# Patient Record
Sex: Female | Born: 1950 | Race: White | Hispanic: No | Marital: Married | Smoking: Current every day smoker
Health system: Western US, Academic
[De-identification: ages and names within clinical notes are randomized; demographics above are authoritative.]

## PROBLEM LIST (undated history)

## (undated) DIAGNOSIS — E663 Overweight: Secondary | ICD-10-CM

## (undated) DIAGNOSIS — M25569 Pain in unspecified knee: Secondary | ICD-10-CM

## (undated) DIAGNOSIS — M25469 Effusion, unspecified knee: Secondary | ICD-10-CM

## (undated) DIAGNOSIS — D649 Anemia, unspecified: Secondary | ICD-10-CM

## (undated) DIAGNOSIS — K219 Gastro-esophageal reflux disease without esophagitis: Secondary | ICD-10-CM

## (undated) DIAGNOSIS — R7302 Impaired glucose tolerance (oral): Secondary | ICD-10-CM

## (undated) DIAGNOSIS — M549 Dorsalgia, unspecified: Secondary | ICD-10-CM

## (undated) DIAGNOSIS — R0602 Shortness of breath: Secondary | ICD-10-CM

## (undated) DIAGNOSIS — Z86010 Personal history of colonic polyps: Secondary | ICD-10-CM

## (undated) DIAGNOSIS — N390 Urinary tract infection, site not specified: Secondary | ICD-10-CM

## (undated) DIAGNOSIS — R002 Palpitations: Secondary | ICD-10-CM

## (undated) DIAGNOSIS — J209 Acute bronchitis, unspecified: Secondary | ICD-10-CM

## (undated) DIAGNOSIS — S335XXA Sprain of ligaments of lumbar spine, initial encounter: Secondary | ICD-10-CM

## (undated) DIAGNOSIS — R7309 Other abnormal glucose: Secondary | ICD-10-CM

## (undated) DIAGNOSIS — R079 Chest pain, unspecified: Secondary | ICD-10-CM

## (undated) DIAGNOSIS — I10 Essential (primary) hypertension: Secondary | ICD-10-CM

## (undated) DIAGNOSIS — M79609 Pain in unspecified limb: Secondary | ICD-10-CM

## (undated) DIAGNOSIS — G4710 Hypersomnia, unspecified: Secondary | ICD-10-CM

## (undated) DIAGNOSIS — E785 Hyperlipidemia, unspecified: Secondary | ICD-10-CM

## (undated) DIAGNOSIS — F411 Generalized anxiety disorder: Secondary | ICD-10-CM

## (undated) DIAGNOSIS — J309 Allergic rhinitis, unspecified: Secondary | ICD-10-CM

## (undated) DIAGNOSIS — R5383 Other fatigue: Secondary | ICD-10-CM

## (undated) DIAGNOSIS — F329 Major depressive disorder, single episode, unspecified: Secondary | ICD-10-CM

## (undated) DIAGNOSIS — M533 Sacrococcygeal disorders, not elsewhere classified: Secondary | ICD-10-CM

## (undated) DIAGNOSIS — R5381 Other malaise: Secondary | ICD-10-CM

## (undated) DIAGNOSIS — G5600 Carpal tunnel syndrome, unspecified upper limb: Secondary | ICD-10-CM

## (undated) DIAGNOSIS — F32A Depression, unspecified: Secondary | ICD-10-CM

## (undated) DIAGNOSIS — F419 Anxiety disorder, unspecified: Secondary | ICD-10-CM

## (undated) HISTORY — DX: Shortness of breath: R06.02

## (undated) HISTORY — DX: Other abnormal glucose: R73.09

## (undated) HISTORY — DX: Major depressive disorder, single episode, unspecified: F32.9

## (undated) HISTORY — DX: Other malaise: R53.81

## (undated) HISTORY — DX: Anemia, unspecified: D64.9

## (undated) HISTORY — DX: Overweight: E66.3

## (undated) HISTORY — DX: Other fatigue: R53.83

## (undated) HISTORY — DX: Palpitations: R00.2

## (undated) HISTORY — DX: Pain in unspecified limb: M79.609

## (undated) HISTORY — DX: Personal history of colonic polyps: Z86.010

## (undated) HISTORY — DX: Urinary tract infection, site not specified: N39.0

## (undated) HISTORY — DX: Sprain of ligaments of lumbar spine, initial encounter: S33.5XXA

## (undated) HISTORY — DX: Gastro-esophageal reflux disease without esophagitis: K21.9

## (undated) HISTORY — PX: COLONOSCOPY: SHX174

## (undated) HISTORY — DX: Hyperlipidemia, unspecified: E78.5

## (undated) HISTORY — PX: OTHER SURGICAL HISTORY: SHX169

## (undated) HISTORY — DX: Allergic rhinitis, unspecified: J30.9

## (undated) HISTORY — DX: Generalized anxiety disorder: F41.1

## (undated) HISTORY — DX: Effusion, unspecified knee: M25.469

## (undated) HISTORY — PX: POLYPECTOMY: SHX149

## (undated) HISTORY — DX: Sacrococcygeal disorders, not elsewhere classified: M53.3

## (undated) HISTORY — DX: Pain in unspecified knee: M25.569

## (undated) HISTORY — DX: Dorsalgia, unspecified: M54.9

## (undated) HISTORY — DX: Impaired glucose tolerance (oral): R73.02

## (undated) HISTORY — DX: Essential (primary) hypertension: I10

## (undated) HISTORY — DX: Carpal tunnel syndrome, unspecified upper limb: G56.00

## (undated) HISTORY — DX: Acute bronchitis, unspecified: J20.9

## (undated) HISTORY — DX: Chest pain, unspecified: R07.9

## (undated) HISTORY — DX: Hypersomnia, unspecified: G47.10

## (undated) HISTORY — DX: Depression, unspecified: F32.A

## (undated) HISTORY — PX: PB LAP GASTRIC BYPASS/ROUX-EN-Y: 43644

## (undated) HISTORY — PX: PB REDUCTION OF LARGE BREAST: 19318

## (undated) HISTORY — DX: Anxiety disorder, unspecified: F41.9

---

## 2001-06-25 ENCOUNTER — Encounter: Admission: RE | Admit: 2001-06-25 | Discharge: 2001-06-25 | Payer: Self-pay | Admitting: *Deleted

## 2001-06-25 ENCOUNTER — Encounter: Payer: Self-pay | Admitting: *Deleted

## 2001-07-09 ENCOUNTER — Ambulatory Visit (HOSPITAL_COMMUNITY): Admission: RE | Admit: 2001-07-09 | Discharge: 2001-07-09 | Payer: Self-pay | Admitting: *Deleted

## 2001-07-09 ENCOUNTER — Encounter (INDEPENDENT_AMBULATORY_CARE_PROVIDER_SITE_OTHER): Payer: Self-pay | Admitting: *Deleted

## 2002-04-01 ENCOUNTER — Other Ambulatory Visit: Admission: RE | Admit: 2002-04-01 | Discharge: 2002-04-01 | Payer: Self-pay | Admitting: Obstetrics and Gynecology

## 2002-07-30 ENCOUNTER — Other Ambulatory Visit: Admission: RE | Admit: 2002-07-30 | Discharge: 2002-07-30 | Payer: Self-pay | Admitting: Obstetrics and Gynecology

## 2004-06-22 ENCOUNTER — Other Ambulatory Visit: Admission: RE | Admit: 2004-06-22 | Discharge: 2004-06-22 | Payer: Self-pay | Admitting: Obstetrics and Gynecology

## 2004-07-14 ENCOUNTER — Ambulatory Visit (HOSPITAL_COMMUNITY): Admission: RE | Admit: 2004-07-14 | Discharge: 2004-07-14 | Payer: Self-pay | Admitting: Obstetrics and Gynecology

## 2004-07-14 ENCOUNTER — Encounter (INDEPENDENT_AMBULATORY_CARE_PROVIDER_SITE_OTHER): Payer: Self-pay | Admitting: *Deleted

## 2004-09-08 ENCOUNTER — Ambulatory Visit (HOSPITAL_COMMUNITY): Admission: RE | Admit: 2004-09-08 | Discharge: 2004-09-08 | Payer: Self-pay | Admitting: Internal Medicine

## 2004-09-08 ENCOUNTER — Ambulatory Visit: Payer: Self-pay | Admitting: Internal Medicine

## 2004-09-11 ENCOUNTER — Ambulatory Visit: Payer: Self-pay | Admitting: Internal Medicine

## 2006-03-11 ENCOUNTER — Ambulatory Visit: Payer: Self-pay | Admitting: Internal Medicine

## 2006-08-01 ENCOUNTER — Ambulatory Visit: Payer: Self-pay | Admitting: Internal Medicine

## 2006-08-15 ENCOUNTER — Ambulatory Visit: Payer: Self-pay | Admitting: Internal Medicine

## 2006-08-15 LAB — HM COLONOSCOPY: HM Colonoscopy: ABNORMAL

## 2006-11-11 ENCOUNTER — Ambulatory Visit: Payer: Self-pay | Admitting: Internal Medicine

## 2006-11-11 LAB — CONVERTED CEMR LAB
ALT: 25 units/L (ref 0–40)
AST: 23 units/L (ref 0–37)
Basophils Relative: 0.7 % (ref 0.0–1.0)
Bilirubin Urine: NEGATIVE
Bilirubin, Direct: 0.2 mg/dL (ref 0.0–0.3)
Calcium: 9.4 mg/dL (ref 8.4–10.5)
Chloride: 105 meq/L (ref 96–112)
Creatinine, Ser: 0.9 mg/dL (ref 0.4–1.2)
Crystals: NEGATIVE
Direct LDL: 108.6 mg/dL
Eosinophils Absolute: 0.2 10*3/uL (ref 0.0–0.6)
Eosinophils Relative: 2.1 % (ref 0.0–5.0)
GFR calc non Af Amer: 69 mL/min
Glucose, Bld: 102 mg/dL — ABNORMAL HIGH (ref 70–99)
HDL: 41.8 mg/dL (ref >39.0)
Hemoglobin: 13.8 g/dL (ref 12.0–15.0)
Ketones, ur: NEGATIVE mg/dL
Leukocytes, UA: NEGATIVE
Lymphocytes Relative: 28.5 % (ref 12.0–46.0)
MCV: 87.4 fL (ref 78.0–100.0)
Monocytes Absolute: 0.7 10*3/uL (ref 0.2–0.7)
Mucus, UA: NEGATIVE
Neutro Abs: 4.9 10*3/uL (ref 1.4–7.7)
Neutrophils Relative %: 60.7 % (ref 43.0–77.0)
Nitrite: NEGATIVE
Platelets: 276 10*3/uL (ref 150–400)
Sodium: 143 meq/L (ref 135–145)
TSH: 1.01 microintl units/mL (ref 0.35–5.50)
Total Bilirubin: 0.9 mg/dL (ref 0.3–1.2)
Total Protein, Urine: NEGATIVE mg/dL
Urine Glucose: NEGATIVE mg/dL
Urobilinogen, UA: 0.2 (ref 0.0–1.0)
VLDL: 46 mg/dL — ABNORMAL HIGH (ref 0–40)
Vitamin B-12: 266 pg/mL (ref 211–911)
WBC: 8.2 10*3/uL (ref 4.5–10.5)

## 2006-11-13 ENCOUNTER — Ambulatory Visit: Payer: Self-pay | Admitting: Cardiology

## 2006-12-04 ENCOUNTER — Ambulatory Visit: Payer: Self-pay | Admitting: Internal Medicine

## 2007-03-31 ENCOUNTER — Ambulatory Visit: Payer: Self-pay | Admitting: Internal Medicine

## 2007-04-29 ENCOUNTER — Ambulatory Visit: Payer: Self-pay | Admitting: Internal Medicine

## 2007-04-29 ENCOUNTER — Ambulatory Visit: Payer: Self-pay | Admitting: Vascular Surgery

## 2007-04-29 ENCOUNTER — Encounter: Payer: Self-pay | Admitting: Internal Medicine

## 2007-04-29 ENCOUNTER — Ambulatory Visit: Admission: RE | Admit: 2007-04-29 | Discharge: 2007-04-29 | Payer: Self-pay | Admitting: Internal Medicine

## 2007-04-29 DIAGNOSIS — K219 Gastro-esophageal reflux disease without esophagitis: Secondary | ICD-10-CM | POA: Insufficient documentation

## 2007-04-29 DIAGNOSIS — I10 Essential (primary) hypertension: Secondary | ICD-10-CM

## 2007-04-29 DIAGNOSIS — Z860100 Personal history of colon polyps, unspecified: Secondary | ICD-10-CM | POA: Insufficient documentation

## 2007-04-29 DIAGNOSIS — E785 Hyperlipidemia, unspecified: Secondary | ICD-10-CM

## 2007-04-29 DIAGNOSIS — F3289 Other specified depressive episodes: Secondary | ICD-10-CM

## 2007-04-29 DIAGNOSIS — F329 Major depressive disorder, single episode, unspecified: Secondary | ICD-10-CM

## 2007-04-29 DIAGNOSIS — E663 Overweight: Secondary | ICD-10-CM | POA: Insufficient documentation

## 2007-04-29 DIAGNOSIS — F411 Generalized anxiety disorder: Secondary | ICD-10-CM | POA: Insufficient documentation

## 2007-04-29 DIAGNOSIS — G5600 Carpal tunnel syndrome, unspecified upper limb: Secondary | ICD-10-CM

## 2007-04-29 HISTORY — DX: Essential (primary) hypertension: I10

## 2007-04-29 HISTORY — DX: Gastro-esophageal reflux disease without esophagitis: K21.9

## 2007-04-29 HISTORY — DX: Generalized anxiety disorder: F41.1

## 2007-04-29 HISTORY — DX: Major depressive disorder, single episode, unspecified: F32.9

## 2007-04-29 HISTORY — DX: Other specified depressive episodes: F32.89

## 2007-04-29 HISTORY — DX: Personal history of colon polyps, unspecified: Z86.0100

## 2007-04-29 HISTORY — DX: Hyperlipidemia, unspecified: E78.5

## 2007-04-29 HISTORY — DX: Carpal tunnel syndrome, unspecified upper limb: G56.00

## 2007-04-29 HISTORY — DX: Personal history of colonic polyps: Z86.010

## 2007-04-29 HISTORY — DX: Overweight: E66.3

## 2007-07-11 ENCOUNTER — Ambulatory Visit: Payer: Self-pay | Admitting: Internal Medicine

## 2007-11-24 ENCOUNTER — Ambulatory Visit: Payer: Self-pay | Admitting: Internal Medicine

## 2007-11-24 DIAGNOSIS — R5381 Other malaise: Secondary | ICD-10-CM

## 2007-11-24 DIAGNOSIS — D649 Anemia, unspecified: Secondary | ICD-10-CM

## 2007-11-24 DIAGNOSIS — R5383 Other fatigue: Secondary | ICD-10-CM

## 2007-11-24 DIAGNOSIS — J309 Allergic rhinitis, unspecified: Secondary | ICD-10-CM

## 2007-11-24 DIAGNOSIS — G4710 Hypersomnia, unspecified: Secondary | ICD-10-CM

## 2007-11-24 HISTORY — DX: Allergic rhinitis, unspecified: J30.9

## 2007-11-24 HISTORY — DX: Anemia, unspecified: D64.9

## 2007-11-24 HISTORY — DX: Hypersomnia, unspecified: G47.10

## 2007-11-24 HISTORY — DX: Other fatigue: R53.81

## 2007-11-24 LAB — CONVERTED CEMR LAB
ALT: 30 units/L (ref 0–35)
Albumin: 3.5 g/dL (ref 3.5–5.2)
Alkaline Phosphatase: 75 units/L (ref 39–117)
BUN: 11 mg/dL (ref 6–23)
Basophils Absolute: 0.1 10*3/uL (ref 0.0–0.1)
Bilirubin, Direct: 0.2 mg/dL (ref 0.0–0.3)
Calcium: 9.3 mg/dL (ref 8.4–10.5)
Chloride: 102 meq/L (ref 96–112)
Eosinophils Absolute: 0.3 10*3/uL (ref 0.0–0.6)
GFR calc Af Amer: 74 mL/min
GFR calc non Af Amer: 61 mL/min
HDL: 43.4 mg/dL (ref >39.0)
Lymphocytes Relative: 25.5 % (ref 12.0–46.0)
MCV: 88.1 fL (ref 78.0–100.0)
Monocytes Relative: 7.1 % (ref 3.0–11.0)
Neutro Abs: 5.1 10*3/uL (ref 1.4–7.7)
Platelets: 276 10*3/uL (ref 150–400)
RBC: 4.47 M/uL (ref 3.87–5.11)
Total CHOL/HDL Ratio: 4.1
Triglycerides: 174 mg/dL — ABNORMAL HIGH (ref 0–149)
VLDL: 35 mg/dL (ref 0–40)

## 2007-11-25 DIAGNOSIS — R7309 Other abnormal glucose: Secondary | ICD-10-CM

## 2007-11-25 HISTORY — DX: Other abnormal glucose: R73.09

## 2007-11-26 LAB — CONVERTED CEMR LAB
ALT: 30 units/L (ref 0–35)
Alkaline Phosphatase: 75 units/L (ref 39–117)
BUN: 11 mg/dL (ref 6–23)
Basophils Relative: 0.8 % (ref 0.0–1.0)
CO2: 30 meq/L (ref 19–32)
Calcium: 9.3 mg/dL (ref 8.4–10.5)
GFR calc Af Amer: 74 mL/min
GFR calc non Af Amer: 61 mL/min
HDL: 43.4 mg/dL (ref >39.0)
LDL Cholesterol: 101 mg/dL — ABNORMAL HIGH (ref 0–99)
Lymphocytes Relative: 25.5 % (ref 12.0–46.0)
Monocytes Relative: 7.1 % (ref 3.0–11.0)
Neutro Abs: 5.1 10*3/uL (ref 1.4–7.7)
Platelets: 276 10*3/uL (ref 150–400)
RBC: 4.47 M/uL (ref 3.87–5.11)
Total CHOL/HDL Ratio: 4.1
Total Protein: 6.8 g/dL (ref 6.0–8.3)
Triglycerides: 174 mg/dL — ABNORMAL HIGH (ref 0–149)
VLDL: 35 mg/dL (ref 0–40)

## 2008-01-13 ENCOUNTER — Ambulatory Visit: Payer: Self-pay | Admitting: Internal Medicine

## 2008-02-02 ENCOUNTER — Encounter: Payer: Self-pay | Admitting: Internal Medicine

## 2008-06-18 ENCOUNTER — Ambulatory Visit: Payer: Self-pay | Admitting: Internal Medicine

## 2008-06-18 DIAGNOSIS — M25569 Pain in unspecified knee: Secondary | ICD-10-CM

## 2008-06-18 DIAGNOSIS — M25469 Effusion, unspecified knee: Secondary | ICD-10-CM

## 2008-06-18 HISTORY — DX: Effusion, unspecified knee: M25.469

## 2008-06-18 HISTORY — DX: Pain in unspecified knee: M25.569

## 2008-06-25 ENCOUNTER — Encounter: Admission: RE | Admit: 2008-06-25 | Discharge: 2008-06-25 | Payer: Self-pay | Admitting: Internal Medicine

## 2008-07-06 ENCOUNTER — Encounter: Payer: Self-pay | Admitting: Internal Medicine

## 2008-07-07 ENCOUNTER — Ambulatory Visit: Payer: Self-pay | Admitting: Internal Medicine

## 2008-09-06 ENCOUNTER — Ambulatory Visit: Payer: Self-pay | Admitting: Internal Medicine

## 2008-09-06 DIAGNOSIS — M549 Dorsalgia, unspecified: Secondary | ICD-10-CM

## 2008-09-06 HISTORY — DX: Dorsalgia, unspecified: M54.9

## 2008-10-06 ENCOUNTER — Encounter (INDEPENDENT_AMBULATORY_CARE_PROVIDER_SITE_OTHER): Payer: Self-pay | Admitting: *Deleted

## 2008-10-06 ENCOUNTER — Telehealth (INDEPENDENT_AMBULATORY_CARE_PROVIDER_SITE_OTHER): Payer: Self-pay | Admitting: *Deleted

## 2008-10-08 HISTORY — PX: OTHER SURGICAL HISTORY: SHX169

## 2008-11-04 ENCOUNTER — Ambulatory Visit: Payer: Self-pay | Admitting: Internal Medicine

## 2008-11-04 DIAGNOSIS — J209 Acute bronchitis, unspecified: Secondary | ICD-10-CM

## 2008-11-04 HISTORY — DX: Acute bronchitis, unspecified: J20.9

## 2008-11-04 LAB — CONVERTED CEMR LAB
Basophils Absolute: 0.1 10*3/uL (ref 0.0–0.1)
Basophils Relative: 0.7 % (ref 0.0–3.0)
Bilirubin Urine: NEGATIVE
Bilirubin, Direct: 0.1 mg/dL (ref 0.0–0.3)
Calcium: 9.2 mg/dL (ref 8.4–10.5)
Cholesterol: 169 mg/dL (ref 0–200)
Creatinine, Ser: 0.9 mg/dL (ref 0.4–1.2)
Eosinophils Absolute: 0.2 10*3/uL (ref 0.0–0.7)
Folate: 10.6 ng/mL
GFR calc Af Amer: 83 mL/min
Glucose, Bld: 112 mg/dL — ABNORMAL HIGH (ref 70–99)
Ketones, ur: NEGATIVE mg/dL
LDL Cholesterol: 96 mg/dL (ref 0–99)
MCHC: 33.9 g/dL (ref 30.0–36.0)
MCV: 89.2 fL (ref 78.0–100.0)
Neutrophils Relative %: 63 % (ref 43.0–77.0)
Platelets: 270 10*3/uL (ref 150–400)
Sed Rate: 37 mm/hr — ABNORMAL HIGH (ref 0–22)
Sodium: 139 meq/L (ref 135–145)
TSH: 1.63 microintl units/mL (ref 0.35–5.50)
Total Protein: 7.2 g/dL (ref 6.0–8.3)
Triglycerides: 148 mg/dL (ref 0–149)
Urine Glucose: NEGATIVE mg/dL
Urobilinogen, UA: 0.2 (ref 0.0–1.0)
VLDL: 30 mg/dL (ref 0–40)

## 2008-11-05 ENCOUNTER — Telehealth (INDEPENDENT_AMBULATORY_CARE_PROVIDER_SITE_OTHER): Payer: Self-pay | Admitting: *Deleted

## 2008-11-08 ENCOUNTER — Telehealth (INDEPENDENT_AMBULATORY_CARE_PROVIDER_SITE_OTHER): Payer: Self-pay | Admitting: *Deleted

## 2008-11-16 ENCOUNTER — Ambulatory Visit: Payer: Self-pay | Admitting: Internal Medicine

## 2008-11-16 DIAGNOSIS — R0602 Shortness of breath: Secondary | ICD-10-CM

## 2008-11-16 DIAGNOSIS — R079 Chest pain, unspecified: Secondary | ICD-10-CM

## 2008-11-16 HISTORY — DX: Chest pain, unspecified: R07.9

## 2008-11-16 HISTORY — DX: Shortness of breath: R06.02

## 2008-11-17 ENCOUNTER — Telehealth: Payer: Self-pay | Admitting: Internal Medicine

## 2008-11-23 ENCOUNTER — Telehealth (INDEPENDENT_AMBULATORY_CARE_PROVIDER_SITE_OTHER): Payer: Self-pay | Admitting: *Deleted

## 2008-12-13 ENCOUNTER — Encounter: Payer: Self-pay | Admitting: Internal Medicine

## 2008-12-14 ENCOUNTER — Encounter: Payer: Self-pay | Admitting: Internal Medicine

## 2008-12-15 ENCOUNTER — Telehealth: Payer: Self-pay | Admitting: Internal Medicine

## 2009-03-17 ENCOUNTER — Encounter: Payer: Self-pay | Admitting: Internal Medicine

## 2009-03-22 ENCOUNTER — Telehealth (INDEPENDENT_AMBULATORY_CARE_PROVIDER_SITE_OTHER): Payer: Self-pay | Admitting: *Deleted

## 2009-03-25 ENCOUNTER — Encounter: Payer: Self-pay | Admitting: Internal Medicine

## 2009-09-06 ENCOUNTER — Ambulatory Visit: Payer: Self-pay | Admitting: Internal Medicine

## 2009-09-06 DIAGNOSIS — N390 Urinary tract infection, site not specified: Secondary | ICD-10-CM

## 2009-09-06 DIAGNOSIS — M79609 Pain in unspecified limb: Secondary | ICD-10-CM

## 2009-09-06 HISTORY — DX: Pain in unspecified limb: M79.609

## 2009-09-06 HISTORY — DX: Urinary tract infection, site not specified: N39.0

## 2009-09-06 LAB — CONVERTED CEMR LAB
Bilirubin Urine: NEGATIVE
Specific Gravity, Urine: 1.015
Urobilinogen, UA: 0.2
WBC Urine, dipstick: NEGATIVE
pH: 6.5

## 2009-11-01 ENCOUNTER — Ambulatory Visit: Payer: Self-pay | Admitting: Internal Medicine

## 2009-11-01 DIAGNOSIS — S339XXA Sprain of unspecified parts of lumbar spine and pelvis, initial encounter: Secondary | ICD-10-CM

## 2009-11-01 DIAGNOSIS — S335XXA Sprain of ligaments of lumbar spine, initial encounter: Secondary | ICD-10-CM

## 2009-11-01 HISTORY — DX: Sprain of unspecified parts of lumbar spine and pelvis, initial encounter: S33.9XXA

## 2009-11-02 LAB — CONVERTED CEMR LAB
ALT: 14 units/L (ref 0–35)
AST: 22 units/L (ref 0–37)
BUN: 8 mg/dL (ref 6–23)
Basophils Absolute: 0 10*3/uL (ref 0.0–0.1)
Bilirubin Urine: NEGATIVE
Bilirubin, Direct: 0.1 mg/dL (ref 0.0–0.3)
Calcium: 9.6 mg/dL (ref 8.4–10.5)
Cholesterol: 190 mg/dL (ref 0–200)
Creatinine, Ser: 0.7 mg/dL (ref 0.4–1.2)
Eosinophils Relative: 2.7 % (ref 0.0–5.0)
GFR calc non Af Amer: 91.48 mL/min (ref >60)
Glucose, Bld: 80 mg/dL (ref 70–99)
Hemoglobin, Urine: NEGATIVE
LDL Cholesterol: 110 mg/dL — ABNORMAL HIGH (ref 0–99)
Lymphocytes Relative: 35 % (ref 12.0–46.0)
Monocytes Relative: 8 % (ref 3.0–12.0)
Neutrophils Relative %: 53.4 % (ref 43.0–77.0)
Platelets: 218 10*3/uL (ref 150.0–400.0)
Potassium: 4.1 meq/L (ref 3.5–5.1)
RDW: 13.3 % (ref 11.5–14.6)
Total Bilirubin: 0.8 mg/dL (ref 0.3–1.2)
Total CHOL/HDL Ratio: 4
Total Protein, Urine: NEGATIVE mg/dL
Triglycerides: 154 mg/dL — ABNORMAL HIGH (ref 0.0–149.0)
Urine Glucose: NEGATIVE mg/dL
Urobilinogen, UA: 0.2 (ref 0.0–1.0)
VLDL: 30.8 mg/dL (ref 0.0–40.0)
WBC: 5 10*3/uL (ref 4.5–10.5)

## 2009-11-03 ENCOUNTER — Telehealth: Payer: Self-pay | Admitting: Internal Medicine

## 2010-02-21 ENCOUNTER — Ambulatory Visit: Payer: Self-pay | Admitting: Internal Medicine

## 2010-02-21 DIAGNOSIS — M533 Sacrococcygeal disorders, not elsewhere classified: Secondary | ICD-10-CM | POA: Insufficient documentation

## 2010-02-21 HISTORY — DX: Sacrococcygeal disorders, not elsewhere classified: M53.3

## 2010-07-27 ENCOUNTER — Observation Stay (HOSPITAL_COMMUNITY)
Admission: EM | Admit: 2010-07-27 | Discharge: 2010-07-28 | Payer: Self-pay | Source: Home / Self Care | Admitting: Emergency Medicine

## 2010-07-27 ENCOUNTER — Ambulatory Visit: Payer: Self-pay | Admitting: Cardiovascular Disease

## 2010-07-28 ENCOUNTER — Encounter (INDEPENDENT_AMBULATORY_CARE_PROVIDER_SITE_OTHER): Payer: Self-pay | Admitting: Internal Medicine

## 2010-09-13 ENCOUNTER — Ambulatory Visit: Payer: Self-pay | Admitting: Internal Medicine

## 2010-09-13 DIAGNOSIS — R002 Palpitations: Secondary | ICD-10-CM

## 2010-09-13 HISTORY — DX: Palpitations: R00.2

## 2010-10-29 ENCOUNTER — Encounter: Payer: Self-pay | Admitting: Internal Medicine

## 2010-11-07 NOTE — Assessment & Plan Note (Signed)
Summary: elep bp/racing heart/#/cd   Vital Signs:  Patient profile:   60 year old female Height:      62 inches Weight:      174.13 pounds BMI:     31.96 O2 Sat:      98 % on Room air Temp:     98 degrees F oral Pulse rate:   74 / minute BP sitting:   120 / 82  (left arm) Cuff size:   regular  Vitals Entered By: Zella Ball Ewing CMA (AAMA) (September 13, 2010 11:22 AM)  O2 Flow:  Room air CC: BP elevated, heart racing/RE   CC:  BP elevated and heart racing/RE.  History of Present Illness: here to f/u; recently hospd with elev BP and heart racing with essentialy neg w/u including hgb, TSH, [place], and routine labs, echo and urine.  Has ongoing anxiety problem, and thinks she has panic on occasion, stil very tearful and "feels empty inside" ;  has been on paxil for > 10 yrs., and recent incr to 40 mg did seem to help, but no longer in the past 6 mo or so.  Does excercise 2 hrs per day with much better diet, wt stable, scared she will have heart attack at any moment;  had stress test last at [facility] negative.  Recent echo normal . Pt denies CP, worsening sob, doe, wheezing, orthopnea, pnd, worsening LE edema,  dizziness or syncope  Pt denies new neuro symptoms such as headache, facial or extremity weakness  Pt denies polydipsia, polyuria  Overall good compliance with meds, trying to follow low chol diet. Denies worsening depressive symptoms, suicidal ideation. Has ongoing stressor with autistic son  Preventive Screening-Counseling & Management      Drug Use:  no.    Problems Prior to Update: 1)  Palpitations  (ICD-785.1) 2)  Coccygeal Pain  (ICD-724.79) 3)  Preventive Health Care  (ICD-V70.0) 4)  Sprain and Strain of Lumbosacral  (ICD-846.0) 5)  Leg Pain, Left  (ICD-729.5) 6)  Uti  (ICD-599.0) 7)  Back Pain  (ICD-724.5) 8)  Dyspnea  (ICD-786.05) 9)  Chest Pain  (ICD-786.50) 10)  Asthmatic Bronchitis, Acute  (ICD-466.0) 11)  Back Pain  (ICD-724.5) 12)  Joint Effusion, Left Knee   (ICD-719.06) 13)  Knee Pain, Right  (ICD-719.46) 14)  Other Abnormal Glucose  (ICD-790.29) 15)  Hypersomnia  (ICD-780.54) 16)  Fatigue  (ICD-780.79) 17)  Allergic Rhinitis  (ICD-477.9) 18)  Anemia-nos  (ICD-285.9) 19)  Carpal Tunnel Syndrome, Bilateral  (ICD-354.0) 20)  Hypertension  (ICD-401.9) 21)  Hyperlipidemia  (ICD-272.4) 22)  Gerd  (ICD-530.81) 23)  Colonic Polyps, Hx of  (ICD-V12.72) 24)  Overweight  (ICD-278.02) 25)  Depression  (ICD-311) 26)  Anxiety  (ICD-300.00)  Medications Prior to Update: 1)  Paxil 40 Mg Tabs (Paroxetine Hcl) .Marland Kitchen.. 1 By Mouth Once Daily 2)  Ecotrin Low Strength 81 Mg  Tbec (Aspirin) .Marland Kitchen.. 1po Qd 3)  Losartan Potassium 100 Mg Tabs (Losartan Potassium) .Marland Kitchen.. 1 By Mouth Once Daily  Current Medications (verified): 1)  Paxil 40 Mg Tabs (Paroxetine Hcl) .Marland Kitchen.. 1 By Mouth Once Daily 2)  Ecotrin Low Strength 81 Mg  Tbec (Aspirin) .Marland Kitchen.. 1po Qd 3)  Losartan Potassium 100 Mg Tabs (Losartan Potassium) .Marland Kitchen.. 1 By Mouth Once Daily 4)  Abilify 5 Mg Tabs (Aripiprazole) .... 1/2 By Mouth Once Daily  Allergies (verified): 1)  ! Sulfa  Past History:  Past Medical History: Last updated: 11/16/2008 Anxiety Depression Obesity Colonic polyps, hx of - adenomatous  GERD Hyperlipidemia Hypertension Carpal Tunnel Syndrome Anemia-NOS Hypersomnia chronic functional diarrhea Allergic rhinitis Cough/ L Post CP..................................................................................Marland KitchenWert    - CT ordered 11/16/08 >    - ACE d/c'd 11/16/08 >  Past Surgical History: Last updated: 09/06/2009 Caesarean section hx of D&C s/p abdominoplasty with mult revisions s/p gastric bypass 2010 - paid cash in [employer]  Social History: Last updated: 09/13/2010 Married 2 children - 1 autistic Quit smoking in 1988 Alcohol use-no Drug use-no  Risk Factors: Smoking Status: quit (11/24/2007)  Social History: Married 2 children - 1 autistic Quit smoking in 1988 Alcohol  use-no Drug use-no Drug Use:  no  Review of Systems       all otherwise negative per pt -    Physical Exam  General:  alert and overweight-appearing.   Head:  normocephalic and atraumatic.   Eyes:  vision grossly intact, pupils equal, and pupils round.   Ears:  R ear normal and L ear normal.   Nose:  no external deformity and no nasal discharge.   Mouth:  no gingival abnormalities and pharynx pink and moist.   Neck:  supple and no masses.   Lungs:  normal respiratory effort and normal breath sounds.   Heart:  normal rate and regular rhythm.   Msk:  no joint tenderness and no joint swelling.   Extremities:  no edema, no erythema  Psych:  depressed affect and moderately anxious.     Impression & Recommendations:  Problem # 1:  HYPERTENSION (ICD-401.9)  Her updated medication list for this problem includes:    Losartan Potassium 100 Mg Tabs (Losartan potassium) .Marland Kitchen... 1 by mouth once daily  BP today: 120/82 Prior BP: 112/72 (02/21/2010)  Labs Reviewed: K+: 4.1 (11/01/2009) Creat: : 0.7 (11/01/2009)   Chol: 190 (11/01/2009)   HDL: 49.40 (11/01/2009)   LDL: 110 (11/01/2009)   TG: 154.0 (11/01/2009) stable overall by hx and exam, ok to continue meds/tx as is , no further meds needed, consider change to beta blocker if palps persist but hold for now in light of the depressoin  Problem # 2:  DEPRESSION (ICD-311)  Her updated medication list for this problem includes:    Paxil 40 Mg Tabs (Paroxetine hcl) .Marland Kitchen... 1 by mouth once daily to add the lower dose abilify; f/u next visit, or sooner such as ER for any suicidal ideation  Problem # 3:  ANXIETY (ICD-300.00)  Her updated medication list for this problem includes:    Paxil 40 Mg Tabs (Paroxetine hcl) .Marland Kitchen... 1 by mouth once daily declines xanax as needed for what sounds like panic attack   Problem # 4:  PALPITATIONS (ICD-785.1)  benign sounding but cant r/o pheo - to check urine tests  Orders: T-Urine 24 Hr. Catecholamines  [phone]) T-Urine 24 Hr. Metanephrines [phone])  Complete Medication List: 1)  Paxil 40 Mg Tabs (Paroxetine hcl) .Marland Kitchen.. 1 by mouth once daily 2)  Ecotrin Low Strength 81 Mg Tbec (Aspirin) .Marland Kitchen.. 1po qd 3)  Losartan Potassium 100 Mg Tabs (Losartan potassium) .Marland Kitchen.. 1 by mouth once daily 4)  Abilify 5 Mg Tabs (Aripiprazole) .... 1/2 by mouth once daily  Patient Instructions: 1)  Please take all new medications as prescribed - the abilify at HALF of the 5 mg pill per day 2)  Continue all previous medications as before this visit  3)  Please go to the Lab in the basement for your urine tests today  4)  Please schedule a follow-up appointment in 6 weeks for CPX with  labs Prescriptions: ABILIFY 5 MG TABS (ARIPIPRAZOLE) 1 by mouth once daily  #30 x 11   Entered and Authorized by:   Corwin Levins MD   Signed by:   Corwin Levins MD on 09/13/2010   Method used:   Electronically to        [facility] [phone]* (retail)       [address]       [place], [place]  [address]       Ph: [phone]       Fax: [phone]   RxID:   [id number]    Orders Added: 1)  T-Urine 24 Hr. Catecholamines [phone] 2)  T-Urine 24 Hr. Metanephrines [[id number]] 3)  Est. Patient Level IV [29937]

## 2010-11-07 NOTE — Assessment & Plan Note (Signed)
Summary: F/U APPT/#/CD   Vital Signs:  Patient profile:   60 year old female Height:      62 inches Weight:      172.38 pounds BMI:     31.64 O2 Sat:      98 % on Room air Temp:     97.9 degrees F oral Pulse rate:   63 / minute BP sitting:   112 / 72  (left arm) Cuff size:   regular  Vitals Entered ByZella Ball Ewing (Feb 21, 2010 11:28 AM)  O2 Flow:  Room air CC: followup/RE   CC:  followup/RE.  History of Present Illness: here with very tender persistent 8 mo left coccyx tenderness that is very concerning as it is moderate and seems to persist;  she remembers  20 yrs ago [place] with [place] with pain for  6 mo ;;  no recent injury; ovoerall lost 110 lbs since the gastric bypass and has tender area specifically to the left ischial area;  Pt denies CP, sob, doe, wheezing, orthopnea, pnd, worsening LE edema, palps, dizziness or syncope   Pt denies new neuro symptoms such as headache, facial or extremity weakness  Trying to follow lower chol diet.  Taking paxil and doing well with controlled anxiety per pt, no panic, and no depressive symptoms or suicidal ideation.   Trying to follow lower chol diet  Problems Prior to Update: 1)  Coccygeal Pain  (ICD-724.79) 2)  Preventive Health Care  (ICD-V70.0) 3)  Sprain and Strain of Lumbosacral  (ICD-846.0) 4)  Leg Pain, Left  (ICD-729.5) 5)  Uti  (ICD-599.0) 6)  Back Pain  (ICD-724.5) 7)  Dyspnea  (ICD-786.05) 8)  Chest Pain  (ICD-786.50) 9)  Asthmatic Bronchitis, Acute  (ICD-466.0) 10)  Back Pain  (ICD-724.5) 11)  Joint Effusion, Left Knee  (ICD-719.06) 12)  Knee Pain, Right  (ICD-719.46) 13)  Other Abnormal Glucose  (ICD-790.29) 14)  Hypersomnia  (ICD-780.54) 15)  Fatigue  (ICD-780.79) 16)  Allergic Rhinitis  (ICD-477.9) 17)  Anemia-nos  (ICD-285.9) 18)  Carpal Tunnel Syndrome, Bilateral  (ICD-354.0) 19)  Hypertension  (ICD-401.9) 20)  Hyperlipidemia  (ICD-272.4) 21)  Gerd  (ICD-530.81) 22)  Colonic Polyps, Hx of   (ICD-V12.72) 23)  Overweight  (ICD-278.02) 24)  Depression  (ICD-311) 25)  Anxiety  (ICD-300.00)  Medications Prior to Update: 1)  Paxil 40 Mg Tabs (Paroxetine Hcl) .Marland Kitchen.. 1 By Mouth Once Daily 2)  Ecotrin Low Strength 81 Mg  Tbec (Aspirin) .Marland Kitchen.. 1po Qd 3)  Losartan Potassium 100 Mg Tabs (Losartan Potassium) .Marland Kitchen.. 1 By Mouth Once Daily  Current Medications (verified): 1)  Paxil 40 Mg Tabs (Paroxetine Hcl) .Marland Kitchen.. 1 By Mouth Once Daily 2)  Ecotrin Low Strength 81 Mg  Tbec (Aspirin) .Marland Kitchen.. 1po Qd 3)  Losartan Potassium 100 Mg Tabs (Losartan Potassium) .Marland Kitchen.. 1 By Mouth Once Daily  Allergies (verified): 1)  ! Sulfa  Past History:  Past Medical History: Last updated: 11/16/2008 Anxiety Depression Obesity Colonic polyps, hx of - adenomatous GERD Hyperlipidemia Hypertension Carpal Tunnel Syndrome Anemia-NOS Hypersomnia chronic functional diarrhea Allergic rhinitis Cough/ L Post CP..................................................................................Marland KitchenWert    - CT ordered 11/16/08 >    - ACE d/c'd 11/16/08 >  Past Surgical History: Last updated: 09/06/2009 Caesarean section hx of D&C s/p abdominoplasty with mult revisions s/p gastric bypass 2010 - paid cash in [employer]  Social History: Last updated: 11/16/2008 Married 2 children - 1 autistic Quit smoking in 1988 Alcohol use-no  Risk Factors: Smoking  Status: quit (11/24/2007)  Review of Systems       all otherwise negative per pt -    Physical Exam  General:  alert and overweight-appearing.   Head:  normocephalic and atraumatic.   Eyes:  vision grossly intact, pupils equal, and pupils round.   Ears:  R ear normal and L ear normal.   Nose:  no external deformity and no nasal discharge.   Mouth:  no gingival abnormalities and pharynx pink and moist.   Neck:  supple and no masses.   Lungs:  normal respiratory effort and normal breath sounds.   Heart:  normal rate and regular rhythm.   Msk:  tender to left  ischial area Extremities:  no edema, no erythema    Impression & Recommendations:  Problem # 1:  COCCYGEAL PAIN (ICD-724.79) persiistnet, recent films reivewed with pt, refer to ortho per pt request Orders: Orthopedic Surgeon Referral (Ortho Surgeon)  Problem # 2:  HYPERTENSION (ICD-401.9)  Her updated medication list for this problem includes:    Losartan Potassium 100 Mg Tabs (Losartan potassium) .Marland Kitchen... 1 by mouth once daily  BP today: 112/72 Prior BP: 140/100 (11/01/2009)  Labs Reviewed: K+: 4.1 (11/01/2009) Creat: : 0.7 (11/01/2009)   Chol: 190 (11/01/2009)   HDL: 49.40 (11/01/2009)   LDL: 110 (11/01/2009)   TG: 154.0 (11/01/2009) stable overall by hx and exam, ok to continue meds/tx as is   Problem # 3:  HYPERLIPIDEMIA (ICD-272.4)  Labs Reviewed: SGOT: 22 (11/01/2009)   SGPT: 14 (11/01/2009)   HDL:49.40 (11/01/2009), 43.8 (11/04/2008)  LDL:110 (11/01/2009), 96 (52/84/1324)  Chol:190 (11/01/2009), 169 (11/04/2008)  Trig:154.0 (11/01/2009), 148 (11/04/2008) d/w pt - declines statin or other med, to cont diet  Problem # 4:  ANXIETY (ICD-300.00)  Her updated medication list for this problem includes:    Paxil 40 Mg Tabs (Paroxetine hcl) .Marland Kitchen... 1 by mouth once daily treat as above, f/u any worsening signs or symptoms   Complete Medication List: 1)  Paxil 40 Mg Tabs (Paroxetine hcl) .Marland Kitchen.. 1 by mouth once daily 2)  Ecotrin Low Strength 81 Mg Tbec (Aspirin) .Marland Kitchen.. 1po qd 3)  Losartan Potassium 100 Mg Tabs (Losartan potassium) .Marland Kitchen.. 1 by mouth once daily  Patient Instructions: 1)  Continue all previous medications as before this visit  2)  You will be contacted about the referral(s) to: orthopedic 3)  Please schedule a follow-up appointment Jan 2012 with CPX labs

## 2010-11-07 NOTE — Assessment & Plan Note (Signed)
Summary: "PROBLEM" WILL DISCUSS W/MD ONLY/#/CD   Vital Signs:  Patient profile:   60 year old female Height:      62 inches Weight:      178 pounds BMI:     32.67 O2 Sat:      98 % on Room air Temp:     98 degrees F oral Pulse rate:   65 / minute BP sitting:   140 / 100  (left arm) Cuff size:   regular  Vitals Entered ByZella Ball Ewing (November 01, 2009 9:30 AM)  O2 Flow:  Room air  CC: sch. labs, tail bone pain, referral OBGYN/RE   CC:  sch. labs, tail bone pain, and referral OBGYN/RE.  History of Present Illness: here with chronic talibone pain , assoc with the wt loss, tender to touch - worse to walk and sit;  no falls or injury,  no fever, unintentional wt loss (lost approx 100 lbs after the gastric  bypass;  due for GYN eval and needs referral;  declines lfu shot today; still c/o years of left thigh/? muscle pain to the left thigh but also has swellign to left leg more than right;  BP has not been elevated in the recent past;  has been trying to follow lower chol diet; has been off statin since july and just tyring diet; Pt denies CP, sob, doe, wheezing, orthopnea, pnd, worsening LE edema, palps, dizziness or syncope   Pt denies new neuro symptoms such as headache, facial or extremity weakness   Problems Prior to Update: 1)  Preventive Health Care  (ICD-V70.0) 2)  Sprain and Strain of Lumbosacral  (ICD-846.0) 3)  Leg Pain, Left  (ICD-729.5) 4)  Uti  (ICD-599.0) 5)  Back Pain  (ICD-724.5) 6)  Dyspnea  (ICD-786.05) 7)  Chest Pain  (ICD-786.50) 8)  Asthmatic Bronchitis, Acute  (ICD-466.0) 9)  Back Pain  (ICD-724.5) 10)  Joint Effusion, Left Knee  (ICD-719.06) 11)  Knee Pain, Right  (ICD-719.46) 12)  Other Abnormal Glucose  (ICD-790.29) 13)  Hypersomnia  (ICD-780.54) 14)  Fatigue  (ICD-780.79) 15)  Allergic Rhinitis  (ICD-477.9) 16)  Anemia-nos  (ICD-285.9) 17)  Carpal Tunnel Syndrome, Bilateral  (ICD-354.0) 18)  Hypertension  (ICD-401.9) 19)  Hyperlipidemia   (ICD-272.4) 20)  Gerd  (ICD-530.81) 21)  Colonic Polyps, Hx of  (ICD-V12.72) 22)  Overweight  (ICD-278.02) 23)  Depression  (ICD-311) 24)  Anxiety  (ICD-300.00)  Medications Prior to Update: 1)  Omeprazole 20 Mg Cpdr (Omeprazole) .... 2 By Mouth Once Daily 2)  Paxil 40 Mg Tabs (Paroxetine Hcl) .Marland Kitchen.. 1 By Mouth Once Daily 3)  Ecotrin Low Strength 81 Mg  Tbec (Aspirin) .Marland Kitchen.. 1po Qd 4)  Ciprofloxacin Hcl 500 Mg Tabs (Ciprofloxacin Hcl) .Marland Kitchen.. 1 By Mouth Two Times A Day  Current Medications (verified): 1)  Paxil 40 Mg Tabs (Paroxetine Hcl) .Marland Kitchen.. 1 By Mouth Once Daily 2)  Ecotrin Low Strength 81 Mg  Tbec (Aspirin) .Marland Kitchen.. 1po Qd  Allergies (verified): 1)  ! Sulfa  Past History:  Past Medical History: Last updated: 11/16/2008 Anxiety Depression Obesity Colonic polyps, hx of - adenomatous GERD Hyperlipidemia Hypertension Carpal Tunnel Syndrome Anemia-NOS Hypersomnia chronic functional diarrhea Allergic rhinitis Cough/ L Post CP..................................................................................Marland KitchenWert    - CT ordered 11/16/08 >    - ACE d/c'd 11/16/08 >  Past Surgical History: Last updated: 09/06/2009 Caesarean section hx of D&C s/p abdominoplasty with mult revisions s/p gastric bypass 2010 - paid cash in [place]  Family History: Last updated: 11/24/2007 breast  cancer prostate cancer heart disease rare renal disease in son  Social History: Last updated: 11/16/2008 Married 2 children - 1 autistic Quit smoking in 1988 Alcohol use-no  Risk Factors: Smoking Status: quit (11/24/2007)  Review of Systems  The patient denies anorexia, fever, weight loss, weight gain, vision loss, decreased hearing, hoarseness, chest pain, syncope, dyspnea on exertion, peripheral edema, prolonged cough, headaches, hemoptysis, abdominal pain, melena, hematochezia, severe indigestion/heartburn, hematuria, muscle weakness, suspicious skin lesions, difficulty walking, depression,  unusual weight change, abnormal bleeding, and enlarged lymph nodes.         all otherwise negative per pt  Physical Exam  General:  alert and overweight-appearing but much improved Head:  normocephalic and atraumatic.  normocephalic and atraumatic.   Eyes:  vision grossly intact, pupils equal, and pupils round.   Ears:  R ear normal and L ear normal.   Nose:  no external deformity and no nasal discharge.   Mouth:  no gingival abnormalities and pharynx pink and moist.   Neck:  supple and no masses.   Lungs:  normal respiratory effort and normal breath sounds.   Heart:  normal rate and regular rhythm.   Abdomen:  soft, non-tender, and normal bowel sounds.   Msk:  no joint tenderness and no joint swelling.  , lower sacral tender noted, also tender over left greater trochanter mild noted Extremities:  no edema, no erythema except for trace edema LLE  to the knee Neurologic:  cranial nerves II-XII intact, strength normal in lower extremities, and sensation intact to light touch.   Skin:  color normal and no rashes.   Psych:  normally interactive and slightly anxious.     Impression & Recommendations:  Problem # 1:  Preventive Health Care (ICD-V70.0)  Overall doing well, age appropriate education and counseling updated and referral for appropriate preventive services done unless declined, immunizations up to date or declined, diet counseling done if overweight, urged to quit smoking if smokes , most recent labs reviewed and current ordered if appropriate, ecg reviewed or declined (interpretation per ECG scanned in the EMR if done); information regarding Medicare Prevention requirements given if appropriate   Orders: T-Vitamin D (25-Hydroxy) ([id number]) TLB-BMP (Basic Metabolic Panel-BMET) (80048-METABOL) TLB-CBC Platelet - w/Differential (85025-CBCD) TLB-Hepatic/Liver Function Pnl (80076-HEPATIC) TLB-Lipid Panel (80061-LIPID) TLB-TSH (Thyroid Stimulating Hormone) (84443-TSH) TLB-Udip  ONLY (81003-UDIP)  Problem # 2:  SPRAIN AND STRAIN OF LUMBOSACRAL (ICD-846.0) suspect msk strain - for films, tylenol as needed, f/u any worsening s/s Orders: T-Coccyx/Sacrum 2 Views (72220TC) T-Lumbar Spine Complete, 5 Views (71110TC) T-Sacroiliac Joints (72202TC)  Problem # 3:  LEG PAIN, LEFT (ICD-729.5) c/w left hip bursitis clinically - she declines ortho eval and cortisone shot, cont tylenol as needed   Problem # 4:  HYPERLIPIDEMIA (ICD-272.4)  Labs Reviewed: SGOT: 36 (11/04/2008)   SGPT: 35 (11/04/2008)   HDL:43.8 (11/04/2008), 43.4 (11/25/2007)  LDL:96 (11/04/2008), 101 (09/81/1914)  Chol:169 (11/04/2008), 179 (11/25/2007)  Trig:148 (11/04/2008), 174 (11/25/2007) may need to re-start statin  Complete Medication List: 1)  Paxil 40 Mg Tabs (Paroxetine hcl) .Marland Kitchen.. 1 by mouth once daily 2)  Ecotrin Low Strength 81 Mg Tbec (Aspirin) .Marland Kitchen.. 1po qd  Patient Instructions: 1)  please call for your yearly pap smear and mammogram - consider [facility], and  Imaging  2)  Please go to the Lab in the basement for your blood and/or urine tests today 3)  Please go to Radiology in the basement level for your X-Ray today 4)  please check your blood pressure daily  -  call for average blood pressure more than 140/90 in 1 to 2 wks 5)  please continue tylenol for pain 6)  Continue all previous medications as before this visit  7)  Please schedule a follow-up appointment in 1 year or sooner if needed Prescriptions: PAXIL 40 MG TABS (PAROXETINE HCL) 1 by mouth once daily  #90 x 3   Entered and Authorized by:   Corwin Levins MD   Signed by:   Corwin Levins MD on 11/01/2009   Method used:   Electronically to        [facility] [phone]* (retail)       [address]       [place], [place]  [address]       Ph: [phone]       Fax: [phone]   RxID:   [id number]

## 2010-11-07 NOTE — Progress Notes (Signed)
Summary: BP  Phone Note Call from Patient Call back at Home Phone [phone]   Summary of Call: Patient left message on triage to call her regarding BP. Yesterday AM it was 171/98 and at 9 pm last night it was 199/99. Initial call taken by: Lucious Groves,  November 03, 2009 8:58 AM  Follow-up for Phone Call        Pt states that she thinks she may need to re-start BP meds. please advise Follow-up by: Margaret Pyle, CMA,  November 03, 2009 10:27 AM  Additional Follow-up for Phone Call Additional follow up Details #1::        i have concerns about how accurate her BP is at home, but will go ahead and start losartan 100 mg per day - done escript;  will need ROV to re-check 4 wks; pt to call if BP's remain elevated, or come to office for nurse visit at her convenience WITH her machine to check Additional Follow-up by: Corwin Levins MD,  November 03, 2009 1:03 PM    Additional Follow-up for Phone Call Additional follow up Details #2::    left message on machine for pt to return my call. Margaret Pyle, CMA  November 03, 2009 1:59 PM   Pt says she saw Dr. Laural Benes last year and was given samples of Benicar, which she took this morning (pt did not mention this the first time I spoke with her). Pt says that her BP is now 136/82. Pt is requesting a Rx for Benicar instead. Follow-up by: Margaret Pyle, CMA,  November 03, 2009 2:37 PM  Additional Follow-up for Phone Call Additional follow up Details #3:: Details for Additional Follow-up Action Taken: I would not feel comfortable with that as benicar is very strong and likely to make her BP too low since she goes up and down apparently so much;  the losartan is the SAME type of medication (just not as strong), once per day , NO SIDE EFFECTS (just like the benicar) and generic (costs less);  please try the losartan Additional Follow-up by: Corwin Levins MD,  November 03, 2009 2:45 PM  New/Updated Medications: LOSARTAN POTASSIUM 100  MG TABS (LOSARTAN POTASSIUM) 1 by mouth once daily Prescriptions: LOSARTAN POTASSIUM 100 MG TABS (LOSARTAN POTASSIUM) 1 by mouth once daily  #90 x 3   Entered and Authorized by:   Corwin Levins MD   Signed by:   Corwin Levins MD on 11/03/2009   Method used:   Electronically to        [facility] [phone]* (retail)       [address]       [place], [place]  [address]       Ph: [phone]       Fax: [phone]   RxID:   [id number]  pt informed. Margaret Pyle, CMA  November 03, 2009 3:13 PM

## 2010-12-20 LAB — CBC
HCT: 34.5 % — ABNORMAL LOW (ref 36.0–46.0)
Hemoglobin: 11.8 g/dL — ABNORMAL LOW (ref 12.0–15.0)
Hemoglobin: 13.4 g/dL (ref 12.0–15.0)
MCH: 31.1 pg (ref 26.0–34.0)
MCHC: 34 g/dL (ref 30.0–36.0)
MCV: 91.4 fL (ref 78.0–100.0)
MCV: 91.4 fL (ref 78.0–100.0)
RBC: 4.31 MIL/uL (ref 3.87–5.11)
RDW: 13.2 % (ref 11.5–15.5)

## 2010-12-20 LAB — POCT I-STAT, CHEM 8
Calcium, Ion: 1.14 mmol/L (ref 1.12–1.32)
Creatinine, Ser: 0.9 mg/dL (ref 0.4–1.2)
Glucose, Bld: 80 mg/dL (ref 70–99)
HCT: 40 % (ref 36.0–46.0)
Hemoglobin: 13.6 g/dL (ref 12.0–15.0)
Potassium: 3.8 mEq/L (ref 3.5–5.1)
TCO2: 26 mmol/L (ref 0–100)

## 2010-12-20 LAB — URINE CULTURE
Colony Count: >=100000
Culture  Setup Time: 201110210206

## 2010-12-20 LAB — BASIC METABOLIC PANEL
BUN: 9 mg/dL (ref 6–23)
Creatinine, Ser: 0.75 mg/dL (ref 0.4–1.2)
GFR calc non Af Amer: >60 mL/min (ref >60)
Glucose, Bld: 91 mg/dL (ref 70–99)

## 2010-12-20 LAB — URINALYSIS, ROUTINE W REFLEX MICROSCOPIC
Glucose, UA: NEGATIVE mg/dL
Hgb urine dipstick: NEGATIVE
Ketones, ur: NEGATIVE mg/dL
Protein, ur: NEGATIVE mg/dL
pH: 6.5 (ref 5.0–8.0)

## 2010-12-20 LAB — CULTURE, BLOOD (ROUTINE X 2)
Culture  Setup Time: 201110210142
Culture: NO GROWTH
Culture: NO GROWTH

## 2010-12-20 LAB — LIPID PANEL
Cholesterol: 159 mg/dL (ref 0–200)
HDL: 56 mg/dL (ref >39)
LDL Cholesterol: 88 mg/dL (ref 0–99)
Total CHOL/HDL Ratio: 2.8 RATIO
Triglycerides: 76 mg/dL (ref <150)
VLDL: 15 mg/dL (ref 0–40)

## 2010-12-20 LAB — POCT CARDIAC MARKERS: CKMB, poc: <1 ng/mL — ABNORMAL LOW (ref 1.0–8.0)

## 2010-12-20 LAB — DIFFERENTIAL
Eosinophils Absolute: 0.1 10*3/uL (ref 0.0–0.7)
Eosinophils Relative: 1 % (ref 0–5)
Lymphocytes Relative: 18 % (ref 12–46)
Lymphs Abs: 1.4 10*3/uL (ref 0.7–4.0)
Monocytes Relative: 7 % (ref 3–12)

## 2010-12-20 LAB — HEPATIC FUNCTION PANEL
AST: 20 U/L (ref 0–37)
Albumin: 3.1 g/dL — ABNORMAL LOW (ref 3.5–5.2)
Alkaline Phosphatase: 71 U/L (ref 39–117)
Bilirubin, Direct: <0.1 mg/dL (ref 0.0–0.3)
Total Bilirubin: 0.4 mg/dL (ref 0.3–1.2)

## 2010-12-20 LAB — CARDIAC PANEL(CRET KIN+CKTOT+MB+TROPI)
CK, MB: 1.3 ng/mL (ref 0.3–4.0)
Relative Index: INVALID (ref 0.0–2.5)
Troponin I: 0.01 ng/mL (ref 0.00–0.06)
Troponin I: 0.01 ng/mL (ref 0.00–0.06)

## 2011-01-26 ENCOUNTER — Telehealth: Payer: Self-pay

## 2011-01-26 NOTE — Telephone Encounter (Signed)
[facility] requesting refill on Paroxetine 40 mg last refill 11/01/09 #90 with 3 refills and last OV 09/13/10.

## 2011-01-26 NOTE — Telephone Encounter (Signed)
To robin   

## 2011-01-29 ENCOUNTER — Encounter: Payer: Self-pay | Admitting: Internal Medicine

## 2011-01-29 ENCOUNTER — Ambulatory Visit (INDEPENDENT_AMBULATORY_CARE_PROVIDER_SITE_OTHER): Payer: Commercial Managed Care - PPO | Admitting: Internal Medicine

## 2011-01-29 VITALS — BP 120/80 | HR 65 | Temp 97.7°F | Ht 62.0 in | Wt 189.2 lb

## 2011-01-29 DIAGNOSIS — F411 Generalized anxiety disorder: Secondary | ICD-10-CM

## 2011-01-29 DIAGNOSIS — I10 Essential (primary) hypertension: Secondary | ICD-10-CM

## 2011-01-29 DIAGNOSIS — R7302 Impaired glucose tolerance (oral): Secondary | ICD-10-CM

## 2011-01-29 DIAGNOSIS — F329 Major depressive disorder, single episode, unspecified: Secondary | ICD-10-CM

## 2011-01-29 DIAGNOSIS — R7309 Other abnormal glucose: Secondary | ICD-10-CM

## 2011-01-29 DIAGNOSIS — Z0000 Encounter for general adult medical examination without abnormal findings: Secondary | ICD-10-CM

## 2011-01-29 HISTORY — DX: Impaired glucose tolerance (oral): R73.02

## 2011-01-29 MED ORDER — LOSARTAN POTASSIUM 100 MG PO TABS: 100.0000 mg | ORAL_TABLET | Freq: Every day | ORAL | Status: DC

## 2011-01-29 MED ORDER — PAROXETINE HCL 40 MG PO TABS: 40.0000 mg | ORAL_TABLET | Freq: Every day | ORAL | Status: DC

## 2011-01-29 MED ORDER — ARIPIPRAZOLE 5 MG PO TABS: 2.5000 mg | ORAL_TABLET | Freq: Every day | ORAL | Status: DC

## 2011-01-29 NOTE — Assessment & Plan Note (Signed)
stable overall by hx and exam, most recent lab reviewed with pt, and pt to continue medical treatment as before, verified nonsuicidal today

## 2011-01-29 NOTE — Assessment & Plan Note (Signed)
stable overall by hx and exam, most recent lab reviewed with pt, and pt to continue medical treatment as before  Lab Results  Component Value Date   HGBA1C 6.6* 11/25/2007   Declines lab f/u today, though she has gained wt, to follow better diet

## 2011-01-29 NOTE — Assessment & Plan Note (Signed)
stable overall by hx and exam, most recent lab reviewed with pt, and pt to continue medical treatment as before 

## 2011-01-29 NOTE — Assessment & Plan Note (Addendum)
stable overall by hx and exam, most recent lab reviewed with pt, and pt to continue medical treatment as before  Lab Results  Component Value Date   WBC 7.3 07/27/2010   HGB 11.8* 07/27/2010   HCT 34.5* 07/27/2010   PLT 203 07/27/2010   CHOL  Value: 159        ATP III CLASSIFICATION:  <200     mg/dL   Desirable  161-096  mg/dL   Borderline High  >=045    mg/dL   High        40/98/1191   TRIG 76 07/27/2010   HDL 56 07/27/2010   LDLDIRECT 108.6 11/11/2006   ALT 14 07/27/2010   AST 20 07/27/2010   NA 141 07/27/2010   K 3.8 07/27/2010   CL 110 07/27/2010   CREATININE 0.75 07/27/2010   BUN 9 07/27/2010   CO2 27 07/27/2010   TSH 2.088 07/27/2010   HGBA1C 6.6* 11/25/2007   BP Readings from Last 3 Encounters:  01/29/11 120/80  09/13/10 120/82  02/21/10 112/72

## 2011-01-29 NOTE — Progress Notes (Signed)
Subjective:    Patient ID: Christina Copeland, female    DOB: 1951/08/30, 60 y.o.   MRN: [id number]  HPI Here for  f/u;  Overall doing ok;  Pt denies CP, worsening SOB, DOE, wheezing, orthopnea, PND, worsening LE edema, palpitations, dizziness or syncope.  Pt denies neurological change such as new Headache, facial or extremity weakness.  Pt denies polydipsia, polyuria, or low sugar symptoms. Pt states overall good compliance with treatment and medications, good tolerability, and trying to follow lower cholesterol diet.  Pt denies worsening depressive symptoms, suicidal ideation or panic. No fever, wt loss, night sweats, loss of appetite, or other constitutional symptoms.  Pt states good ability with ADL's, low fall risk, home safety reviewed and adequate, no significant changes in hearing or vision, and occasionally active with exercise. Asks for zpack to take to [place] soon in  Case she gets one of her sinus infecitons.  Had lost wt very nicely, exercise one hr per day for 5 days/wk, but gained 20 lbs per pt, and now interested in gettingg the band surgury done, alhtough she has already had the gatsric bypass.   Past Medical History  Diagnosis Date  . HYPERLIPIDEMIA 04/29/2007  . Overweight 04/29/2007  . ANEMIA-NOS 11/24/2007  . ANXIETY 04/29/2007  . DEPRESSION 04/29/2007  . CARPAL TUNNEL SYNDROME, BILATERAL 04/29/2007  . HYPERTENSION 04/29/2007  . ASTHMATIC BRONCHITIS, ACUTE 11/04/2008  . ALLERGIC RHINITIS 11/24/2007  . GERD 04/29/2007  . UTI 09/06/2009  . JOINT EFFUSION, LEFT KNEE 06/18/2008  . KNEE PAIN, RIGHT 06/18/2008  . BACK PAIN 09/06/2008  . COCCYGEAL PAIN 02/21/2010  . LEG PAIN, LEFT 09/06/2009  . HYPERSOMNIA 11/24/2007  . FATIGUE 11/24/2007  . Palpitations 09/13/2010  . DYSPNEA 11/16/2008  . CHEST PAIN 11/16/2008  . Other abnormal glucose 11/25/2007  . SPRAIN AND STRAIN OF LUMBOSACRAL 11/01/2009  . COLONIC POLYPS, HX OF 04/29/2007  . Impaired glucose tolerance 01/29/2011   Past Surgical History    Procedure Date  . Cesarean section   . S/p abdominoplasty with multi revisions   . S/p gastric bypass 2010    paid cash in [place]    reports that she has been smoking.  She does not have any smokeless tobacco history on file. She reports that she does not drink alcohol or use illicit drugs. family history includes Cancer in her other and Heart disease in her other. Allergies  Allergen Reactions  . Sulfonamide Derivatives    No current outpatient prescriptions on file prior to visit.   Review of Systems Review of Systems  Constitutional: Negative for diaphoresis and unexpected weight change.  HENT: Negative for drooling and tinnitus.   Eyes: Negative for photophobia and visual disturbance.  Respiratory: Negative for choking and stridor.   Gastrointestinal: Negative for vomiting and blood in stool.  Genitourinary: Negative for hematuria and decreased urine volume.  Musculoskeletal: Negative for gait problem.  Skin: Negative for color change and wound.  Neurological: Negative for tremors and numbness.  Psychiatric/Behavioral: Negative for decreased concentration. The patient is not hyperactive.   .      Objective:   Physical Exam BP 120/80  Pulse 65  Temp(Src) 97.7 F (36.5 C) (Oral)  Ht 5\' 2"  (1.575 m)  Wt 189 lb 4 oz (85.843 kg)  BMI 34.61 kg/m2  SpO2 98% Physical Exam  VS noted Constitutional: Pt appears well-developed and well-nourished.  HENT: Head: Normocephalic.  Right Ear: External ear normal.  Left Ear: External ear normal.  Eyes: Conjunctivae and EOM are  normal. Pupils are equal, round, and reactive to light.  Neck: Normal range of motion. Neck supple.  Cardiovascular: Normal rate and regular rhythm.   Pulmonary/Chest: Effort normal and breath sounds normal.  Abd:  Soft, NT, non-distended, + BS Neurological: Pt is alert. No cranial nerve deficit.  Skin: Skin is warm. No erythema.  Psychiatric: Pt behavior is normal. Thought content normal.  1+ nervous,  not depressed appearing         Assessment & Plan:

## 2011-01-29 NOTE — Patient Instructions (Addendum)
Please remember to followup with your GYN for the yearly pap smear and/or mammogram - you can consider [facility] on [address], or [facility] on [address] Continue all other medications as before Please return in August 2012 with Lab testing done 3-5 days before

## 2011-01-29 NOTE — Telephone Encounter (Signed)
To robin   

## 2011-02-23 NOTE — Assessment & Plan Note (Signed)
[facility]                           GASTROENTEROLOGY OFFICE NOTE   Christina Copeland, Christina Copeland                    MRN:          [id number]  DATE:08/01/2006                            DOB:          1950/12/09    REFERRING PHYSICIAN:  Corwin Levins, MD   REASON FOR CONSULTATION:  History of reflux disease, adenomatous colon  polyps, and abdominal pain.   HISTORY:  This is a 60 year old [place] native female with a history of  hypertension, hyperlipidemia, reflux disease, adenomatous colon polyps and  chronic diarrhea who is referred through the courtesy of the Dr. Jonny Ruiz  regarding the above listed issues.  The patient reports essentially a life-  time history of postprandial loose stools.  Associated with urgency.  No  bleeding, no nocturnal component, no weight loss.  In October 2002 she  underwent complete colonoscopy with Dr. Ilene Qua.  Multiple random  biopsies of the colon were unremarkable.  No evidence of microscopic  colitis.  She was also found to have 2 diminutive colon polyps one of which  was adenomatous.   Next, she reports longstanding problems with heartburn and indigestion.  No  dysphagia.  For her problems she takes Nexium which controls symptoms.  Upper endoscopy performed 07/09/2006 revealed an irregular mucosal Z-line  which was biopsied; no Barrett's.  No other abnormalities.  She has also had  abdominoplasty a few years ago.  This was apparently complicated and  required multiple subsequent surgeries.  She reports, now, intermittent  abdominal pain in the midportion of the abdomen left of midline which she  describes as burning.  It is intermittent and not associated with any other  symptoms such as nausea, vomiting, bleeding, fevers.  The pain does not  radiate.  It does not seem to be effected by meals or activity.  It is  occasionally worse with palpation.  The patient reports that it was  recommended by Dr. Jonny Ruiz  that she undergo colonoscopy and upper endoscopy to  evaluate her symptoms as well as provide neoplasia surveillance.   PAST MEDICAL HISTORY:  1. Hypertension.  2. Hyperlipidemia.  3. Reflux disease.  4. History of adenomatous colon polyps.  5. Chronic diarrhea, presumed functional.   PAST SURGICAL HISTORY:  1. Cesarean section x2.  2. Abdominoplasty with multiple subsequent surgeries presumably due to      complications.   ALLERGIES:  Sulfa.   CURRENT MEDICATIONS:  1. Simvastatin 40 mg daily.  2. Paxil 30 mg daily.  3. Zestoretic 20/12.5 mg daily.  4. Nexium 40 mg daily.   FAMILY HISTORY:  Negative for gastrointestinal malignancy.  Father with  heart disease.   SOCIAL HISTORY:  The patient is married.  Her husband is Saed.  They have 2  children.  She does not work.  She does not smoke or use alcohol.   REVIEW OF SYSTEMS:  Per diagnostic evaluation form.   PHYSICAL EXAMINATION:  The patient is alert and oriented in no acute  distress.  Blood pressure 110/78, heart rate is 60, weight is 263 pounds.  She is 5  feet 3 inches in height.  HEENT:  Sclerae are anicteric.  Conjunctiva are pink.  Oral mucosa is  intact.  No adenopathy.  LUNGS:  Clear.  HEART:  Regular.  ABDOMEN:  Obese and soft without tenderness, mass, or hernia.  A large  midline incision as well as transverse incision in the lower abdomen are  well-healed.  No obvious hernia.  The patient declined rectal examination.  She states that she will only  allow this if she is sedated.  EXTREMITIES:  Without edema.   IMPRESSION:  1. History of adenomatous colon polyps.  Due for surveillance colonoscopy.  2. Chronic diarrhea, most likely irritable bowel syndrome.  3. Gastroesophageal reflux disease without alarm symptoms.  Symptoms      controlled on Nexium.  4. Vague abdominal discomfort x1 year, cause uncertain.  No worrisome      features.  5. Morbid obesity.  6. General medical problems, under the  supervision of Dr. Jonny Ruiz.   RECOMMENDATIONS:  1. Colonoscopy with polypectomy if needed.  The nature of the procedure as      well as the risks, benefits, and alternatives were reviewed.  She      understood and agreed to proceed.  The patient requested OsmoPrep.  2. Diagnostic upper endoscopy to evaluate abdominal pain.  3. Continue Nexium.  4. Reflux precautions.  5. Ongoing general medical care with Dr. Jonny Ruiz.    ______________________________  Wilhemina Bonito. Eda Keys., MD    JNP/MedQ  DD: 08/01/2006  DT: 08/02/2006  Job #: [id number]   cc:   Corwin Levins, MD

## 2011-02-23 NOTE — Op Note (Signed)
Christina Copeland, RUMBAUGH NO.:  [id number]   MEDICAL RECORD NO.:  [id number]          PATIENT TYPE:  AMB   LOCATION:  [facility]                           FACILITY:  [facility]   PHYSICIAN:  Carrington Clamp, M.D. DATE OF BIRTH:  December 07, 1950   DATE OF PROCEDURE:  07/14/2004  DATE OF DISCHARGE:                                 OPERATIVE REPORT   PREOPERATIVE DIAGNOSES:  Menorrhagia.   POSTOPERATIVE DIAGNOSES:  Menorrhagia.   PROCEDURE:  Dilation and curettage, hysteroscopy and Novasure ablation.   SURGEON:  Carrington Clamp, M.D.   ANESTHESIA:  LMA.   ESTIMATED BLOOD LOSS:  Minimal.   IV FLUIDS:  1000 mL.   URINE OUTPUT:  Not measured.   COMPLICATIONS:  None.   FINDINGS:  Uterine cavity with shaggy endometrium but n o obvious polyps or  fibroids. The ostia were seen bilaterally.  The uterus sounded to 9 cm and  the cervical length was 4 cm giving a cavity length of 5 cm which was 4.1  cm.  The power was 113 watts and time was 45 seconds.  There appeared to be  good contact of the array as seen by hysteroscope postop although there may  have been a small 1/2 cm portion above the internal os that may not have  been ablated.   MEDICATIONS:  1% plain lidocaine.   PATHOLOGY:  Uterine curettings.   COUNTS:  Correct x3.   TECHNIQUE:  After adequate LMA anesthesia was achieved, the patient was  prepped and draped in the usual sterile fashion, dorsal lithotomy position.  The bladder had been emptied with a red rubber catheter and a speculum  placed. A single tooth tenaculum is used to grasp the cervix and the cervix  was dilated with Shawnie Pons dilators.  The uterine measurements were taken with a  sound and a Hank's dilator and then recorded.  The hysteroscope was passed  into the cavity with the above findings noted. There was minimal deficit of  the hysteroscopy fluid.  A curettage was then performed with sharp curettage  and the Novasure ablation unit then placed in the usual  fashion inside the  cavity. The power was set by the machine at 113 after the cavity assessment  had been successfully  performed and the time that the machine ran was for 45 seconds.  Another  look with the hysteroscope was performed and the above findings noted.  The  patient tolerated the procedure well and was returned to the recovery room  in stable condition.      MH/MEDQ  D:  07/14/2004  T:  07/14/2004  Job:  [id number]

## 2011-04-30 IMAGING — CR DG LUMBAR SPINE COMPLETE 4+V
5 series · 5 of 5 positions shown · non-contrast
Comparison: None

CLINICAL DATA: Low back pain, no acute injury

LUMBAR SPINE - COMPLETE 4+ VIEW

[view not recorded (1 of 5)]
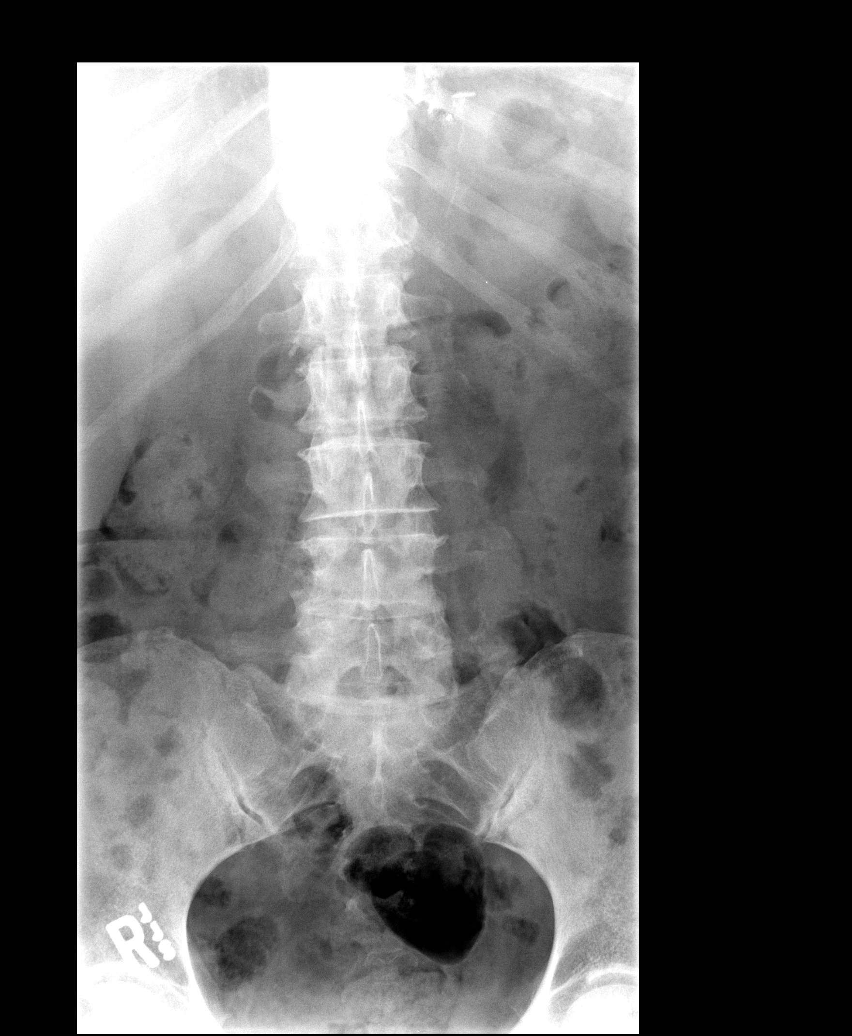

[view not recorded (2 of 5)]
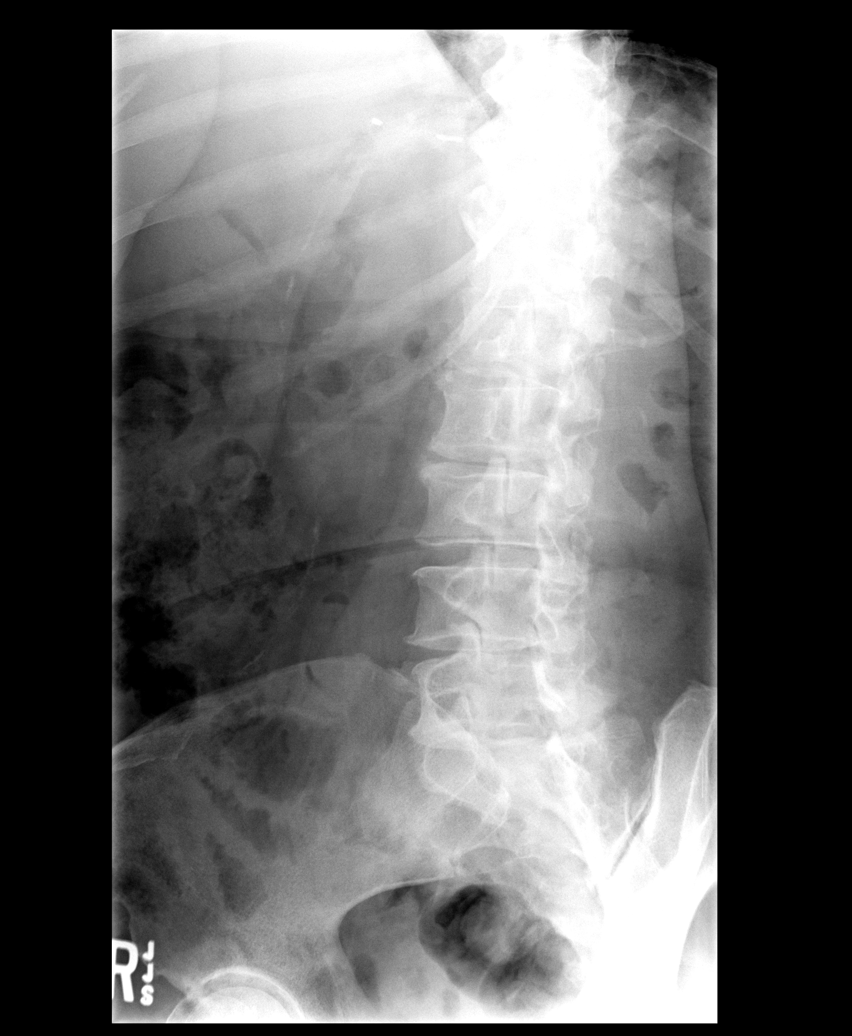

[view not recorded (3 of 5)]
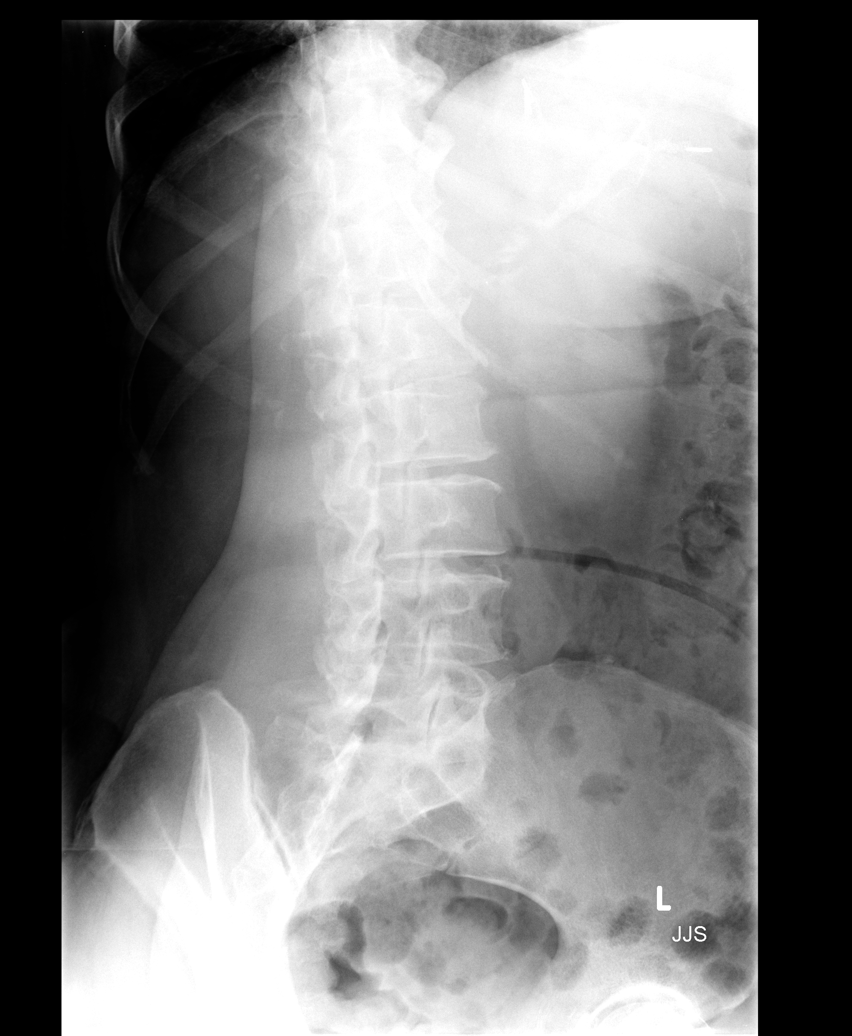

[view not recorded (4 of 5)]
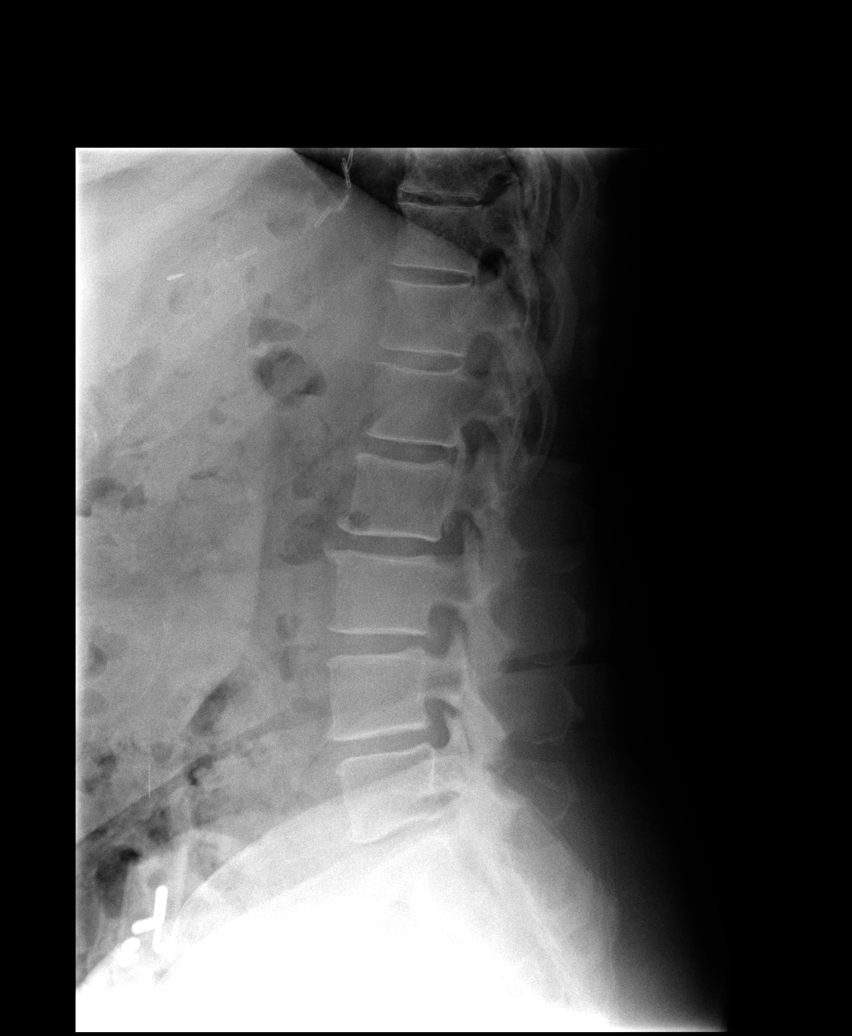

[view not recorded (5 of 5)]
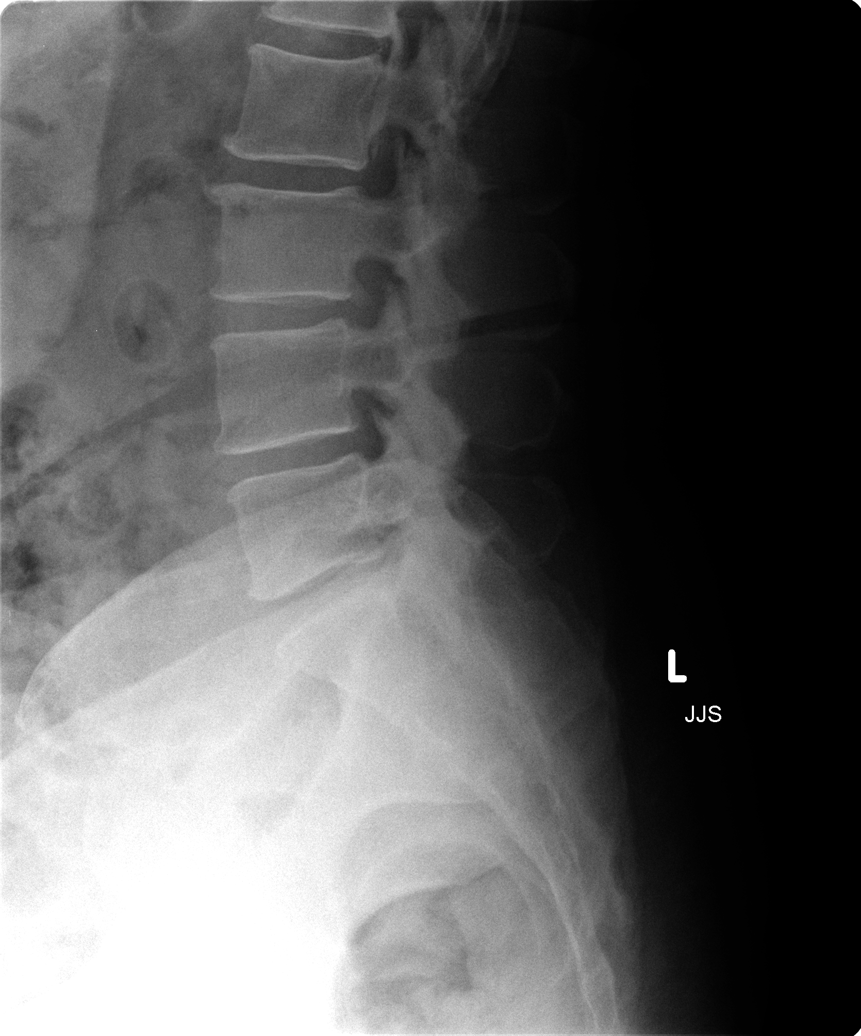

[5 of 5 positions shown; findings below may reference images not displayed]

FINDINGS: The lumbar vertebrae are in normal alignment.  No
compression deformity is seen.  There is mild degenerative change
involving the facet joints of L4-5 and L5-S1.
IMPRESSION: Normal alignment with no acute abnormality.  Mild degenerative
change.

## 2011-06-07 ENCOUNTER — Telehealth: Payer: Self-pay

## 2011-06-07 MED ORDER — ARIPIPRAZOLE 5 MG PO TABS: 2.5000 mg | ORAL_TABLET | Freq: Every day | ORAL | Status: DC

## 2011-06-07 MED ORDER — PAROXETINE HCL 40 MG PO TABS: 40.0000 mg | ORAL_TABLET | Freq: Every day | ORAL | Status: DC

## 2011-06-07 MED ORDER — LOSARTAN POTASSIUM 100 MG PO TABS: 100.0000 mg | ORAL_TABLET | Freq: Every day | ORAL | Status: DC

## 2011-06-07 NOTE — Telephone Encounter (Signed)
A user error has taken place: encounter opened in error, closed for administrative reasons.

## 2011-08-02 ENCOUNTER — Other Ambulatory Visit: Payer: Self-pay | Admitting: Internal Medicine

## 2011-08-02 DIAGNOSIS — Z1231 Encounter for screening mammogram for malignant neoplasm of breast: Secondary | ICD-10-CM

## 2011-08-16 ENCOUNTER — Encounter: Payer: Self-pay | Admitting: Internal Medicine

## 2011-08-24 ENCOUNTER — Ambulatory Visit (HOSPITAL_COMMUNITY)
Admission: RE | Admit: 2011-08-24 | Discharge: 2011-08-24 | Disposition: A | Discharge status: Home / Self Care | Payer: Commercial Managed Care - PPO | Source: Ambulatory Visit | Attending: Internal Medicine | Admitting: Internal Medicine

## 2011-08-24 DIAGNOSIS — Z1231 Encounter for screening mammogram for malignant neoplasm of breast: Secondary | ICD-10-CM

## 2012-01-02 ENCOUNTER — Other Ambulatory Visit (INDEPENDENT_AMBULATORY_CARE_PROVIDER_SITE_OTHER): Payer: Commercial Managed Care - PPO

## 2012-01-02 ENCOUNTER — Other Ambulatory Visit: Payer: Self-pay | Admitting: Internal Medicine

## 2012-01-02 ENCOUNTER — Encounter: Payer: Self-pay | Admitting: Internal Medicine

## 2012-01-02 ENCOUNTER — Ambulatory Visit (INDEPENDENT_AMBULATORY_CARE_PROVIDER_SITE_OTHER): Payer: Commercial Managed Care - PPO | Admitting: Internal Medicine

## 2012-01-02 VITALS — BP 124/92 | HR 69 | Temp 98.1°F | Ht 63.0 in | Wt 213.0 lb

## 2012-01-02 DIAGNOSIS — Z0000 Encounter for general adult medical examination without abnormal findings: Secondary | ICD-10-CM

## 2012-01-02 DIAGNOSIS — R7309 Other abnormal glucose: Secondary | ICD-10-CM

## 2012-01-02 DIAGNOSIS — R7302 Impaired glucose tolerance (oral): Secondary | ICD-10-CM

## 2012-01-02 DIAGNOSIS — I10 Essential (primary) hypertension: Secondary | ICD-10-CM

## 2012-01-02 DIAGNOSIS — E559 Vitamin D deficiency, unspecified: Secondary | ICD-10-CM

## 2012-01-02 LAB — HEPATIC FUNCTION PANEL
AST: 17 U/L (ref 0–37)
Albumin: 3.8 g/dL (ref 3.5–5.2)
Total Bilirubin: 0.8 mg/dL (ref 0.3–1.2)

## 2012-01-02 LAB — CBC WITH DIFFERENTIAL/PLATELET
Basophils Relative: 0.5 % (ref 0.0–3.0)
Eosinophils Relative: 3.2 % (ref 0.0–5.0)
HCT: 41.8 % (ref 36.0–46.0)
Lymphs Abs: 1.8 10*3/uL (ref 0.7–4.0)
Monocytes Relative: 8.2 % (ref 3.0–12.0)
Neutrophils Relative %: 52.9 % (ref 43.0–77.0)
Platelets: 238 10*3/uL (ref 150.0–400.0)
RBC: 4.61 Mil/uL (ref 3.87–5.11)
WBC: 5.2 10*3/uL (ref 4.5–10.5)

## 2012-01-02 LAB — URINALYSIS, ROUTINE W REFLEX MICROSCOPIC
Leukocytes, UA: NEGATIVE
Nitrite: NEGATIVE
Specific Gravity, Urine: 1.015 (ref 1.000–1.030)
Total Protein, Urine: NEGATIVE
pH: 6.5 (ref 5.0–8.0)

## 2012-01-02 LAB — BASIC METABOLIC PANEL
BUN: 11 mg/dL (ref 6–23)
Creatinine, Ser: 0.8 mg/dL (ref 0.4–1.2)
GFR: 81.34 mL/min (ref >60.00)
Glucose, Bld: 86 mg/dL (ref 70–99)
Potassium: 4.2 mEq/L (ref 3.5–5.1)

## 2012-01-02 LAB — TSH: TSH: 1.18 u[IU]/mL (ref 0.35–5.50)

## 2012-01-02 LAB — LDL CHOLESTEROL, DIRECT: Direct LDL: 145.7 mg/dL

## 2012-01-02 MED ORDER — LOSARTAN POTASSIUM-HCTZ 100-12.5 MG PO TABS: 1.0000 | ORAL_TABLET | Freq: Every day | ORAL | Status: DC

## 2012-01-02 MED ORDER — ATORVASTATIN CALCIUM 10 MG PO TABS: 10.0000 mg | ORAL_TABLET | Freq: Every day | ORAL | Status: DC

## 2012-01-02 MED ORDER — ARIPIPRAZOLE 5 MG PO TABS: 2.5000 mg | ORAL_TABLET | Freq: Every day | ORAL | Status: DC

## 2012-01-02 MED ORDER — PAROXETINE HCL 40 MG PO TABS: 40.0000 mg | ORAL_TABLET | Freq: Every day | ORAL | Status: DC

## 2012-01-02 NOTE — Assessment & Plan Note (Signed)
For vit d 1000 units per day

## 2012-01-02 NOTE — Assessment & Plan Note (Signed)
stable overall by hx and exam, most recent data reviewed with pt, and pt to continue medical treatment as before Lab Results  Component Value Date   HGBA1C 6.6* 11/25/2007    

## 2012-01-02 NOTE — Progress Notes (Signed)
Subjective:    Patient ID: Christina Copeland, female    DOB: [birth year], 61 y.o.   MRN: [id number]  HPI  Here for wellness and f/u;  Overall doing ok;  Pt denies CP, worsening SOB, DOE, wheezing, orthopnea, PND, worsening LE edema, palpitations, dizziness or syncope.  Pt denies neurological change such as new Headache, facial or extremity weakness.  Pt denies polydipsia, polyuria, or low sugar symptoms. Pt states overall good compliance with treatment and medications, good tolerability, and trying to follow lower cholesterol diet.  Pt denies worsening depressive symptoms, suicidal ideation or panic. No fever, wt loss, night sweats, loss of appetite, or other constitutional symptoms.  Pt states good ability with ADL's, low fall risk, home safety reviewed and adequate, no significant changes in hearing or vision, and occasionally active with exercise.  Has lost over 100 lbs in the pat, has gained about 40 lbs back .  Would like to look into bariatric surgury. Has not taken vit d since found low in 2011. Abilify healps greatly with anxious, low mood, guilt over gaining wt.  Has tried mult diets, walking for exercise 45 min 3 times per wk Past Medical History  Diagnosis Date  . HYPERLIPIDEMIA 04/29/2007  . Overweight 04/29/2007  . ANEMIA-NOS 11/24/2007  . ANXIETY 04/29/2007  . DEPRESSION 04/29/2007  . CARPAL TUNNEL SYNDROME, BILATERAL 04/29/2007  . HYPERTENSION 04/29/2007  . ASTHMATIC BRONCHITIS, ACUTE 11/04/2008  . ALLERGIC RHINITIS 11/24/2007  . GERD 04/29/2007  . UTI 09/06/2009  . JOINT EFFUSION, LEFT KNEE 06/18/2008  . KNEE PAIN, RIGHT 06/18/2008  . BACK PAIN 09/06/2008  . COCCYGEAL PAIN 02/21/2010  . LEG PAIN, LEFT 09/06/2009  . HYPERSOMNIA 11/24/2007  . FATIGUE 11/24/2007  . Palpitations 09/13/2010  . DYSPNEA 11/16/2008  . CHEST PAIN 11/16/2008  . Other abnormal glucose 11/25/2007  . SPRAIN AND STRAIN OF LUMBOSACRAL 11/01/2009  . COLONIC POLYPS, HX OF 04/29/2007  . Impaired glucose tolerance 01/29/2011  .  Vitamin d deficiency 01/02/2012   Past Surgical History  Procedure Date  . Cesarean section   . S/p abdominoplasty with multi revisions   . S/p gastric bypass 2010    paid cash in [place]    reports that she has been smoking.  She does not have any smokeless tobacco history on file. She reports that she does not drink alcohol or use illicit drugs. family history includes Cancer in her other and Heart disease in her other. Allergies  Allergen Reactions  . Sulfonamide Derivatives    Current Outpatient Prescriptions on File Prior to Visit  Medication Sig Dispense Refill  . aspirin 81 MG EC tablet Take 81 mg by mouth daily.         Review of Systems Review of Systems  Constitutional: Negative for diaphoresis, activity change, appetite change and unexpected weight change.  HENT: Negative for hearing loss, ear pain, facial swelling, mouth sores and neck stiffness.   Eyes: Negative for pain, redness and visual disturbance.  Respiratory: Negative for shortness of breath and wheezing.   Cardiovascular: Negative for chest pain and palpitations.  Gastrointestinal: Negative for diarrhea, blood in stool, abdominal distention and rectal pain.  Genitourinary: Negative for hematuria, flank pain and decreased urine volume.  Musculoskeletal: Negative for myalgias and joint swelling.  Skin: Negative for color change and wound.  Neurological: Negative for syncope and numbness.  Hematological: Negative for adenopathy.  Psychiatric/Behavioral: Negative for hallucinations, self-injury, decreased concentration and agitation.   Objective:   Physical Exam BP 124/92  Pulse 69  Temp(Src) 98.1 F (36.7 C) (Oral)  Ht 5\' 3"  (1.6 m)  Wt 213 lb (96.616 kg)  BMI 37.73 kg/m2  SpO2 98% Physical Exam  VS noted, anxious,  Constitutional: Pt is oriented to person, place, and time. Appears well-developed and well-nourished. /morbid obese HENT:  Head: Normocephalic and atraumatic.  Right Ear: External ear  normal.  Left Ear: External ear normal.  Nose: Nose normal.  Mouth/Throat: Oropharynx is clear and moist.  Eyes: Conjunctivae and EOM are normal. Pupils are equal, round, and reactive to light.  Neck: Normal range of motion. Neck supple. No JVD present. No tracheal deviation present.  Cardiovascular: Normal rate, regular rhythm, normal heart sounds and intact distal pulses.   Pulmonary/Chest: Effort normal and breath sounds normal.  Abdominal: Soft. Bowel sounds are normal. There is no tenderness.  Musculoskeletal: Normal range of motion. Exhibits no edema.  Lymphadenopathy:  Has no cervical adenopathy.  Neurological: Pt is alert and oriented to person, place, and time. Pt has normal reflexes. No cranial nerve deficit.  Skin: Skin is warm and dry. No rash noted.  Psychiatric:  Has  normal mood and affect. Behavior is normal.     Assessment & Plan:

## 2012-01-02 NOTE — Assessment & Plan Note (Signed)
Mild uncontrolled, to change the losartan to losart hct 100/12.5 mg for better control,  to f/u any worsening symptoms or concerns

## 2012-01-02 NOTE — Assessment & Plan Note (Signed)

## 2012-01-02 NOTE — Patient Instructions (Addendum)
Please remember to followup with your GYN for the yearly pap smear and/or mammogram as you do OK to stop the losartan 100 mg when the current bottle is done Then start Losartan-HCT - 100/12.5 mg per day to help with blood pressure further, and swelling Continue all other medications as before Your refills were sent to the pharmacy today Please have the pharmacy call with any other refills you may need. You are other wise up to date with prevention Please go to LAB in the Basement for the blood and/or urine tests to be done today You will be contacted by phone if any changes need to be made immediately.  Otherwise, you will receive a letter about your results with an explanation. You will be contacted regarding the referral for: general surgury for bariatric evalaution Please start the Vitamin D (OTC) 1000 units per day Please return in 1 year for your yearly visit, or sooner if needed, with Lab testing done 3-5 days before

## 2012-01-02 NOTE — Assessment & Plan Note (Signed)
Ok for bariatric referral per pt request

## 2012-01-23 IMAGING — CR DG CHEST 2V
2 series · 2 of 2 positions shown · non-contrast
Comparison: Two-view chest x-ray 11/04/2008 and 03/31/2007.

CLINICAL DATA: Chest pain.  History of hypertension.

CHEST - 2 VIEW 07/27/2010:

[w chest lat]
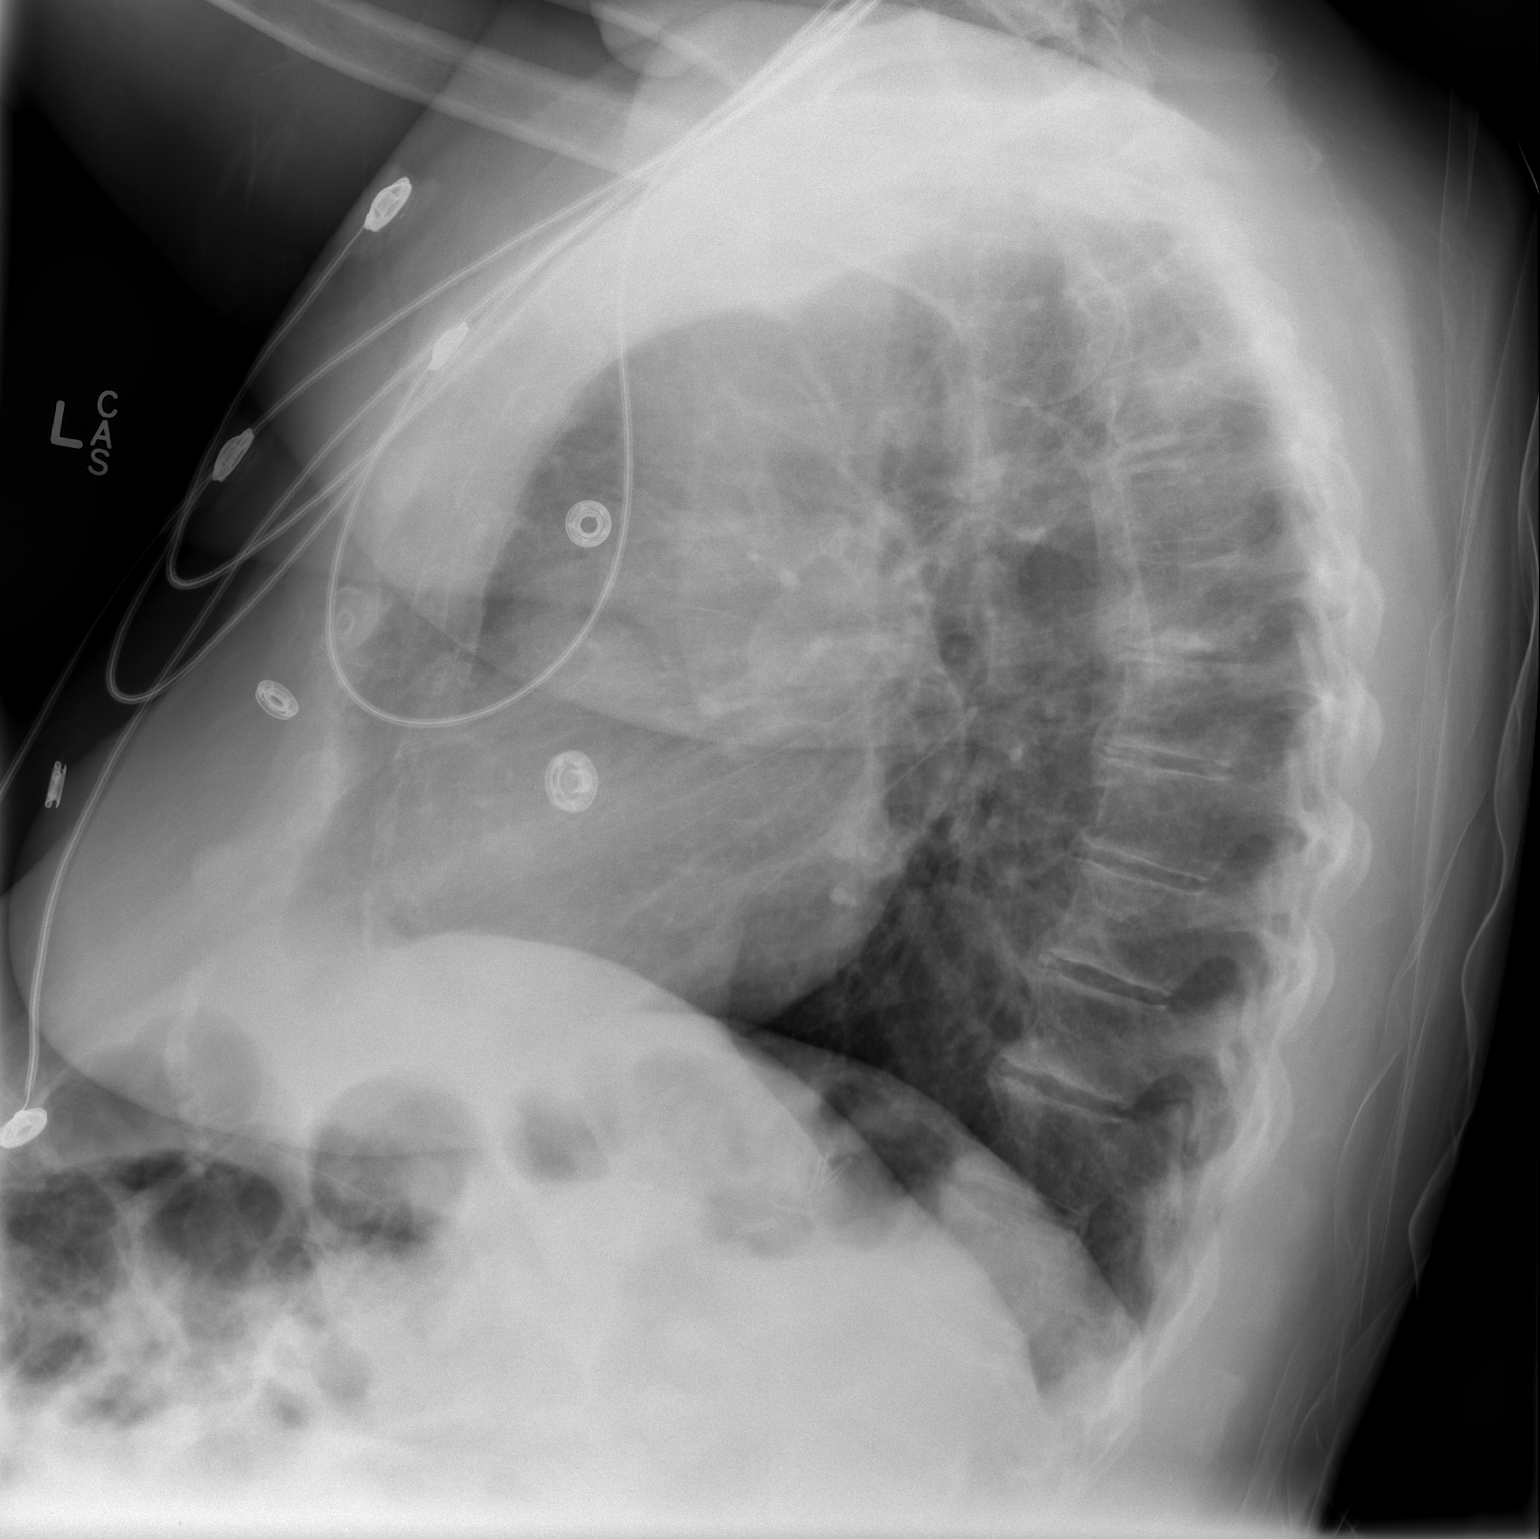

[view not recorded]
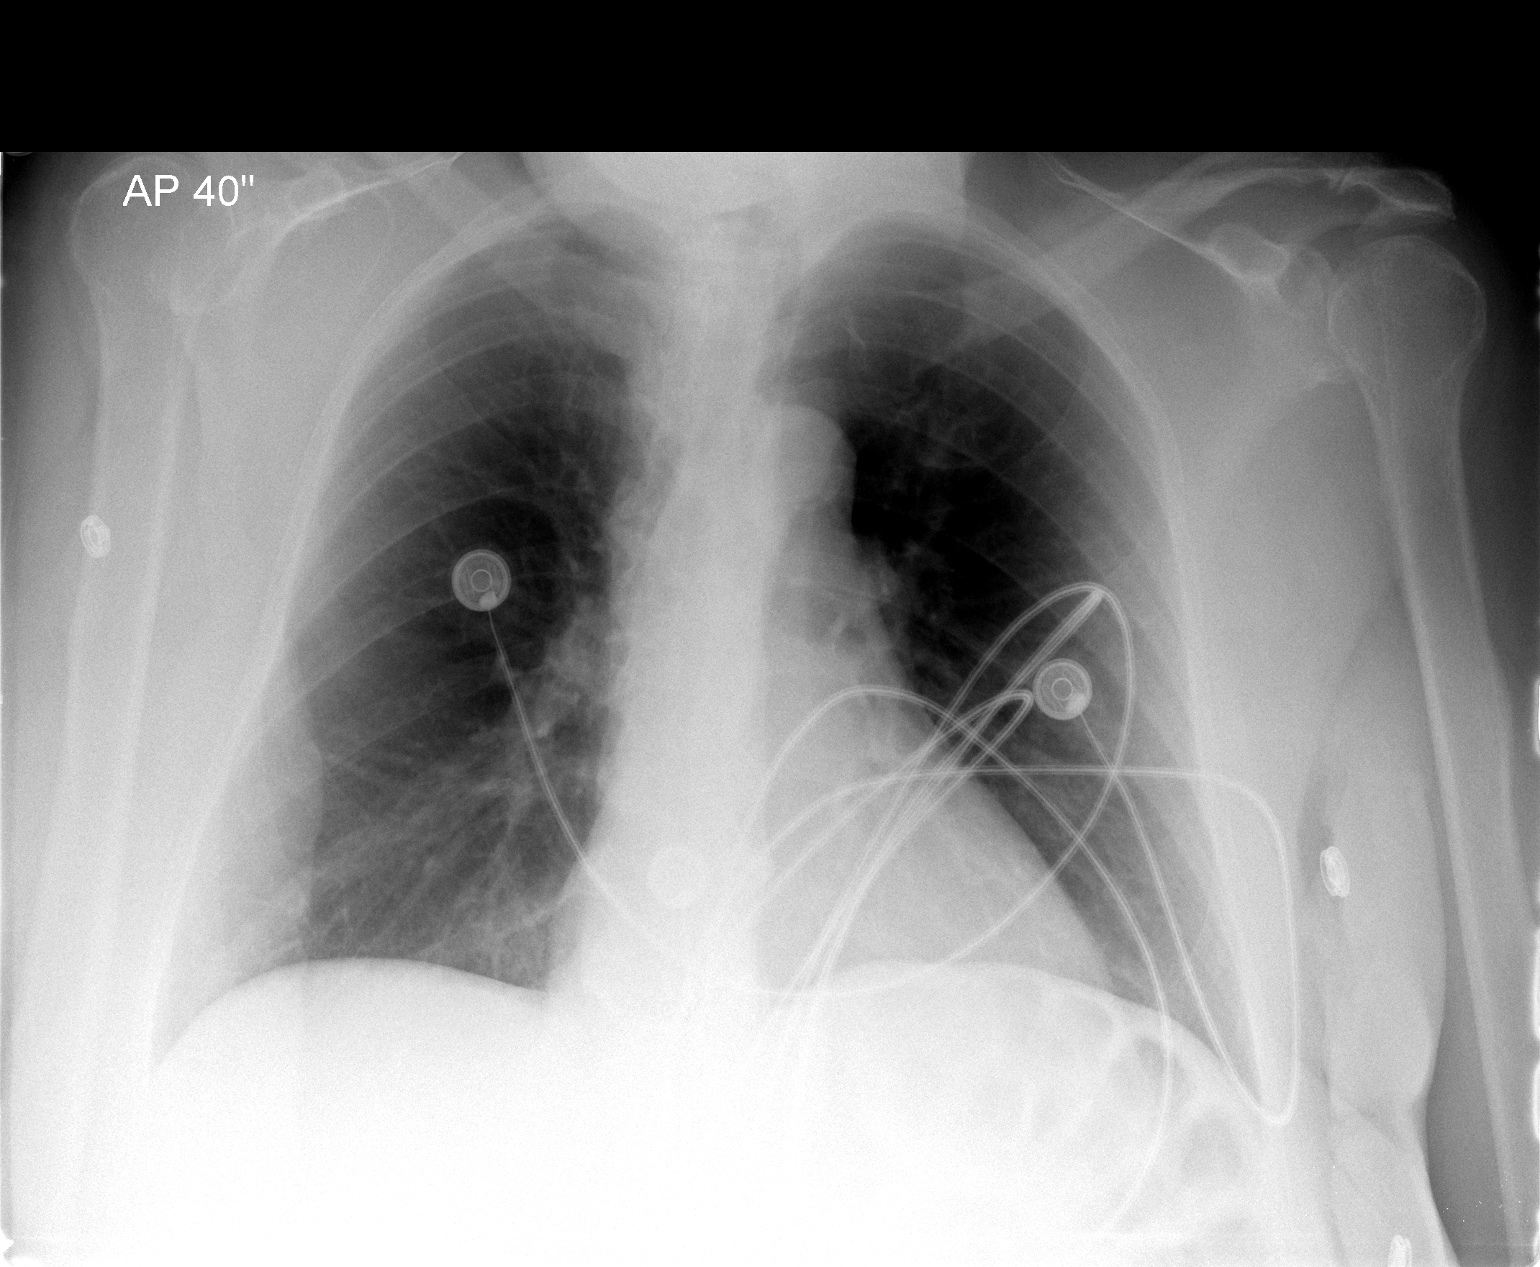

[2 of 2 positions shown; findings below may reference images not displayed]

FINDINGS: Mild enlargement of cardiac silhouette, with interval
increase in the heart size since 5335, stable since 6585.  Thoracic
aorta mildly tortuous and atherosclerotic, unchanged.  Hilar and
mediastinal contours otherwise unremarkable.  Lungs clear.
Bronchovascular markings normal.  Pulmonary vascularity normal.  No
pleural effusions.  Degenerative changes involving the thoracic
spine.
IMPRESSION: Mild cardiomegaly.  No acute cardiopulmonary.

## 2012-01-28 ENCOUNTER — Ambulatory Visit (INDEPENDENT_AMBULATORY_CARE_PROVIDER_SITE_OTHER): Payer: Commercial Managed Care - PPO | Admitting: Internal Medicine

## 2012-01-28 ENCOUNTER — Encounter: Payer: Self-pay | Admitting: Internal Medicine

## 2012-01-28 VITALS — BP 112/82 | HR 87 | Temp 97.1°F | Ht 62.0 in | Wt 207.4 lb

## 2012-01-28 DIAGNOSIS — R7302 Impaired glucose tolerance (oral): Secondary | ICD-10-CM

## 2012-01-28 DIAGNOSIS — E785 Hyperlipidemia, unspecified: Secondary | ICD-10-CM

## 2012-01-28 DIAGNOSIS — I10 Essential (primary) hypertension: Secondary | ICD-10-CM

## 2012-01-28 DIAGNOSIS — R7309 Other abnormal glucose: Secondary | ICD-10-CM

## 2012-01-28 MED ORDER — LOSARTAN POTASSIUM 100 MG PO TABS: 100.0000 mg | ORAL_TABLET | Freq: Every day | ORAL | Status: DC

## 2012-01-28 NOTE — Assessment & Plan Note (Signed)
overcontrolled by hx with recurrent dizziness, to change to losartan 100 qd,  to f/u any worsening symptoms or concerns

## 2012-01-28 NOTE — Assessment & Plan Note (Signed)
stable overall by hx and exam, most recent data reviewed with pt, and pt to continue medical treatment as before Lab Results  Component Value Date   HGBA1C 6.6* 11/25/2007    

## 2012-01-28 NOTE — Patient Instructions (Signed)
OK to stop the losartanHCT Start the Losartan 100 mg per day (you are given the 30 day hardcopy, and the 90 day is [facility] to [facility]) Continue all other medications as before Please stop by the [facility] desk to see if the Bariatric Referral is being looked into/to check on the status Please have the pharmacy call with any refills you may need.

## 2012-01-28 NOTE — Assessment & Plan Note (Signed)
stable overall by hx and exam, most recent data reviewed with pt, and pt to continue medical treatment as before  Lab Results  Component Value Date   [facility]  Value: 64        Total Cholesterol/HDL:CHD Risk Coronary Heart Disease Risk Table                     Men   Women  1/2 Average Risk   3.4   3.3  Average Risk       5.0   4.4  2 X Average Risk   9.6   7.1  3 X Average Risk  23.4   11.0        Use the calculated Patient Ratio above and the CHD Risk Table to determine the patient's CHD Risk.        ATP III CLASSIFICATION (LDL):  <100     mg/dL   Optimal  161-096  mg/dL   Near or Above                    Optimal  130-159  mg/dL   Borderline  045-409  mg/dL   High  >811     mg/dL   Very High 91/47/8295

## 2012-01-28 NOTE — Assessment & Plan Note (Signed)
To see [facility] today regarding status of referral

## 2012-01-28 NOTE — Progress Notes (Signed)
Subjective:    Patient ID: Christina Copeland, female    DOB: [birth year], 61 y.o.   MRN: [id number]  HPI here after on the losartan 100-12.5 mg for 1 wk, no edema but also dizziness and orthostatic type symptosm occuring numerous times per day, really unable to take further.  Pt denies chest pain, increased sob or doe, wheezing, orthopnea, PND, increased LE swelling, palpitations, dizziness or syncope except for the above.  Pt denies new neurological symptoms such as new headache, or facial or extremity weakness or numbness   Pt denies polydipsia, polyuria.  Denies worsening depressive symptoms, suicidal ideation, or panic, though has ongoing anxiety, not increased recently.   lipitor may be causing mild aches to the calves, but minor, so she is willing to cont as is for now.  Never did get a call about the bariatric referral Past Medical History  Diagnosis Date  . HYPERLIPIDEMIA 04/29/2007  . Overweight 04/29/2007  . ANEMIA-NOS 11/24/2007  . ANXIETY 04/29/2007  . DEPRESSION 04/29/2007  . CARPAL TUNNEL SYNDROME, BILATERAL 04/29/2007  . HYPERTENSION 04/29/2007  . ASTHMATIC BRONCHITIS, ACUTE 11/04/2008  . ALLERGIC RHINITIS 11/24/2007  . GERD 04/29/2007  . UTI 09/06/2009  . JOINT EFFUSION, LEFT KNEE 06/18/2008  . KNEE PAIN, RIGHT 06/18/2008  . BACK PAIN 09/06/2008  . COCCYGEAL PAIN 02/21/2010  . LEG PAIN, LEFT 09/06/2009  . HYPERSOMNIA 11/24/2007  . FATIGUE 11/24/2007  . Palpitations 09/13/2010  . DYSPNEA 11/16/2008  . CHEST PAIN 11/16/2008  . Other abnormal glucose 11/25/2007  . SPRAIN AND STRAIN OF LUMBOSACRAL 11/01/2009  . COLONIC POLYPS, HX OF 04/29/2007  . Impaired glucose tolerance 01/29/2011  . Vitamin d deficiency 01/02/2012   Past Surgical History  Procedure Date  . Cesarean section   . S/p abdominoplasty with multi revisions   . S/p gastric bypass 2010    paid cash in [place]    reports that she has been smoking.  She does not have any smokeless tobacco history on file. She reports that she  does not drink alcohol or use illicit drugs. family history includes Cancer in her other and Heart disease in her other. Allergies  Allergen Reactions  . Sulfonamide Derivatives    Current Outpatient Prescriptions on File Prior to Visit  Medication Sig Dispense Refill  . ARIPiprazole (ABILIFY) 5 MG tablet Take 0.5 tablets (2.5 mg total) by mouth daily. 1/2 by mouth daily  45 tablet  3  . aspirin 81 MG EC tablet Take 81 mg by mouth daily.        Marland Kitchen atorvastatin (LIPITOR) 10 MG tablet Take 1 tablet (10 mg total) by mouth daily.  90 tablet  3  . losartan-hydrochlorothiazide (HYZAAR) 100-12.5 MG per tablet Take 1 tablet by mouth daily.  90 tablet  3  . PARoxetine (PAXIL) 40 MG tablet Take 1 tablet (40 mg total) by mouth daily.  90 tablet  3  . DISCONTD: losartan (COZAAR) 100 MG tablet Take 1 tablet (100 mg total) by mouth daily.  90 tablet  2    Review of Systems Review of Systems  Constitutional: Negative for diaphoresis and unexpected weight change.  Respiratory: Negative for choking and stridor.   Gastrointestinal: Negative for vomiting and blood in stool.  Genitourinary: Negative for hematuria and decreased urine volume.  Musculoskeletal: Negative for gait problem.  Skin: Negative for color change and wound.  Neurological: Negative for tremors and numbness.  Psychiatric/Behavioral: Negative for decreased concentration. The patient is not hyperactive.  Objective:   Physical Exam BP 112/82  Pulse 87  Temp(Src) 97.1 F (36.2 C) (Oral)  Ht 5\' 2"  (1.575 m)  Wt 207 lb 6 oz (94.065 kg)  BMI 37.93 kg/m2  SpO2 99% Physical Exam  VS noted Constitutional: Pt appears well-developed and well-nourished.  HENT: Head: Normocephalic.  Right Ear: External ear normal.  Left Ear: External ear normal.  Eyes: Conjunctivae and EOM are normal. Pupils are equal, round, and reactive to light.  Neck: Normal range of motion. Neck supple.  Cardiovascular: Normal rate and regular rhythm.     Pulmonary/Chest: Effort normal and breath sounds normal.  Abd:  Soft, NT, non-distended, + BS Neurological: Pt is alert. No cranial nerve deficit.  Skin: Skin is warm. No erythema. No edema Psychiatric: Pt behavior is normal. Thought content normal.     Assessment & Plan:

## 2012-07-07 ENCOUNTER — Encounter: Payer: Self-pay | Admitting: Internal Medicine

## 2012-07-29 ENCOUNTER — Other Ambulatory Visit: Payer: Self-pay | Admitting: Internal Medicine

## 2012-07-29 DIAGNOSIS — Z1231 Encounter for screening mammogram for malignant neoplasm of breast: Secondary | ICD-10-CM

## 2012-08-01 ENCOUNTER — Ambulatory Visit (AMBULATORY_SURGERY_CENTER): Payer: Commercial Managed Care - PPO

## 2012-08-01 VITALS — Ht 62.0 in | Wt 215.4 lb

## 2012-08-01 DIAGNOSIS — Z860100 Personal history of colon polyps, unspecified: Secondary | ICD-10-CM

## 2012-08-01 DIAGNOSIS — Z1211 Encounter for screening for malignant neoplasm of colon: Secondary | ICD-10-CM

## 2012-08-01 DIAGNOSIS — Z800 Family history of malignant neoplasm of digestive organs: Secondary | ICD-10-CM

## 2012-08-01 MED ORDER — MOVIPREP 100 G PO SOLR: ORAL | Status: DC

## 2012-08-06 ENCOUNTER — Encounter: Payer: Self-pay | Admitting: Internal Medicine

## 2012-08-07 ENCOUNTER — Ambulatory Visit (INDEPENDENT_AMBULATORY_CARE_PROVIDER_SITE_OTHER): Payer: Commercial Managed Care - PPO | Admitting: Internal Medicine

## 2012-08-07 ENCOUNTER — Encounter: Payer: Self-pay | Admitting: Internal Medicine

## 2012-08-07 VITALS — BP 110/70 | HR 86 | Temp 99.2°F | Ht 62.0 in | Wt 217.4 lb

## 2012-08-07 DIAGNOSIS — J019 Acute sinusitis, unspecified: Secondary | ICD-10-CM | POA: Insufficient documentation

## 2012-08-07 DIAGNOSIS — R7309 Other abnormal glucose: Secondary | ICD-10-CM

## 2012-08-07 DIAGNOSIS — R7302 Impaired glucose tolerance (oral): Secondary | ICD-10-CM

## 2012-08-07 DIAGNOSIS — I10 Essential (primary) hypertension: Secondary | ICD-10-CM

## 2012-08-07 MED ORDER — BENZONATATE 100 MG PO CAPS: ORAL_CAPSULE | ORAL | Status: DC

## 2012-08-07 MED ORDER — AZITHROMYCIN 250 MG PO TABS: ORAL_TABLET | ORAL | Status: DC

## 2012-08-07 NOTE — Patient Instructions (Addendum)
Take all new medications as prescribed Continue all other medications as before Please return in March 2014 for your yearly visit, or sooner if needed, with Lab testing done 3-5 days before

## 2012-08-09 ENCOUNTER — Encounter: Payer: Self-pay | Admitting: Internal Medicine

## 2012-08-09 NOTE — Assessment & Plan Note (Signed)
stable overall by hx and exam, most recent data reviewed with pt, and pt to continue medical treatment as before BP Readings from Last 3 Encounters:  08/07/12 110/70  01/28/12 112/82  01/02/12 124/92

## 2012-08-09 NOTE — Assessment & Plan Note (Signed)
stable overall by hx and exam, most recent data reviewed with pt, and pt to continue medical treatment as before Lab Results  Component Value Date   HGBA1C 6.6* 11/25/2007

## 2012-08-09 NOTE — Assessment & Plan Note (Signed)
Mild to mod, for antibx course,  to f/u any worsening symptoms or concerns 

## 2012-08-09 NOTE — Progress Notes (Signed)
Subjective:    Patient ID: Christina Copeland, female    DOB: 02-10-1951, 61 y.o.   MRN: [id number]  HPI   Here with 3 days acute onset fever, facial pain, pressure, general weakness and malaise, and greenish d/c, with slight ST, but little to no cough and Pt denies chest pain, increased sob or doe, wheezing, orthopnea, PND, increased LE swelling, palpitations, dizziness or syncope.  Pt denies new neurological symptoms such as new headache, or facial or extremity weakness or numbness   Pt denies polydipsia, polyuria,  Pt states overall good compliance with meds, trying to follow lower cholesterol diet, wt overall stable but little exercise however.    Pt denies fever, wt loss, night sweats, loss of appetite, or other constitutional symptoms except for the above Past Medical History  Diagnosis Date  . HYPERLIPIDEMIA 04/29/2007  . Overweight 04/29/2007  . ANEMIA-NOS 11/24/2007  . ANXIETY 04/29/2007  . DEPRESSION 04/29/2007  . CARPAL TUNNEL SYNDROME, BILATERAL 04/29/2007  . HYPERTENSION 04/29/2007  . ASTHMATIC BRONCHITIS, ACUTE 11/04/2008  . ALLERGIC RHINITIS 11/24/2007  . GERD 04/29/2007  . UTI 09/06/2009  . JOINT EFFUSION, LEFT KNEE 06/18/2008  . KNEE PAIN, RIGHT 06/18/2008  . BACK PAIN 09/06/2008  . COCCYGEAL PAIN 02/21/2010  . LEG PAIN, LEFT 09/06/2009  . HYPERSOMNIA 11/24/2007  . FATIGUE 11/24/2007  . Palpitations 09/13/2010  . DYSPNEA 11/16/2008  . CHEST PAIN 11/16/2008  . Other abnormal glucose 11/25/2007  . SPRAIN AND STRAIN OF LUMBOSACRAL 11/01/2009  . COLONIC POLYPS, HX OF 04/29/2007  . Impaired glucose tolerance 01/29/2011  . Vitamin D deficiency 01/02/2012   Past Surgical History  Procedure Date  . Cesarean section   . S/p abdominoplasty with multi revisions   . S/p gastric bypass 2010    paid cash in [place]  . Colonoscopy   . Polypectomy     reports that she has been smoking Cigarettes.  She has been smoking about .5 packs per day. She has never used smokeless tobacco. She reports that  she does not drink alcohol or use illicit drugs. family history includes Breast cancer in her maternal aunt; Cancer in her other; Colon cancer in her maternal uncle; Heart disease in her father and other; and Prostate cancer in her father. Allergies  Allergen Reactions  . Sulfonamide Derivatives    Current Outpatient Prescriptions on File Prior to Visit  Medication Sig Dispense Refill  . ARIPiprazole (ABILIFY) 5 MG tablet Take by mouth. 1/2 by mouth daily      . aspirin 81 MG EC tablet Take 81 mg by mouth daily.        Marland Kitchen atorvastatin (LIPITOR) 10 MG tablet Take 1 tablet (10 mg total) by mouth daily.  90 tablet  3  . losartan (COZAAR) 100 MG tablet Take 1 tablet (100 mg total) by mouth daily.  90 tablet  3  . PARoxetine (PAXIL) 40 MG tablet Take 1 tablet (40 mg total) by mouth daily.  90 tablet  3  . MOVIPREP 100 G SOLR Moviprep as directed / no substitutions  1 kit  0   Review of Systems  Constitutional: Negative for diaphoresis and unexpected weight change.  HENT: Negative for tinnitus.   Eyes: Negative for photophobia and visual disturbance.  Respiratory: Negative for choking and stridor.   Gastrointestinal: Negative for vomiting and blood in stool.  Genitourinary: Negative for hematuria and decreased urine volume.  Musculoskeletal: Negative for gait problem.  Skin: Negative for color change and wound.  Neurological: Negative for  tremors and numbness.  Psychiatric/Behavioral: Negative for decreased concentration. The patient is not hyperactive.       Objective:   Physical Exam BP 110/70  Pulse 86  Temp 99.2 F (37.3 C) (Oral)  Ht 5\' 2"  (1.575 m)  Wt 217 lb 6 oz (98.601 kg)  BMI 39.76 kg/m2  SpO2 98% Physical Exam  VS noted, mild ill Constitutional: Pt appears well-developed and well-nourished.  HENT: Head: Normocephalic.  Right Ear: External ear normal.  Left Ear: External ear normal.  Bilat tm's mild erythema.  Sinus tender bilat.  Pharynx mild erythema Eyes:  Conjunctivae and EOM are normal. Pupils are equal, round, and reactive to light.  Neck: Normal range of motion. Neck supple.  Cardiovascular: Normal rate and regular rhythm.   Pulmonary/Chest: Effort normal and breath sounds normal.  Neurological: Pt is alert. Not confused  Skin: Skin is warm. No erythema.  Psychiatric: Pt behavior is normal. Thought content normal.     Assessment & Plan:

## 2012-08-14 ENCOUNTER — Ambulatory Visit (AMBULATORY_SURGERY_CENTER): Payer: Commercial Managed Care - PPO | Admitting: Internal Medicine

## 2012-08-14 ENCOUNTER — Encounter: Payer: Self-pay | Admitting: Internal Medicine

## 2012-08-14 VITALS — BP 140/75 | HR 58 | Temp 97.7°F | Resp 14 | Ht 62.0 in | Wt 215.0 lb

## 2012-08-14 DIAGNOSIS — Z86010 Personal history of colonic polyps: Secondary | ICD-10-CM

## 2012-08-14 DIAGNOSIS — D126 Benign neoplasm of colon, unspecified: Secondary | ICD-10-CM

## 2012-08-14 DIAGNOSIS — Z1211 Encounter for screening for malignant neoplasm of colon: Secondary | ICD-10-CM

## 2012-08-14 MED ORDER — SODIUM CHLORIDE 0.9 % IV SOLN: 500.0000 mL | INTRAVENOUS | Status: DC

## 2012-08-14 NOTE — Patient Instructions (Addendum)
YOU HAD AN ENDOSCOPIC PROCEDURE TODAY AT THE [facility]: Refer to the procedure report that was given to you for any specific questions about what was found during the examination.  If the procedure report does not answer your questions, please call your gastroenterologist to clarify.  If you requested that your care partner not be given the details of your procedure findings, then the procedure report has been included in a sealed envelope for you to review at your convenience later.  YOU SHOULD EXPECT: Some feelings of bloating in the abdomen. Passage of more gas than usual.  Walking can help get rid of the air that was put into your GI tract during the procedure and reduce the bloating. If you had a lower endoscopy (such as a colonoscopy or flexible sigmoidoscopy) you may notice spotting of blood in your stool or on the toilet paper. If you underwent a bowel prep for your procedure, then you may not have a normal bowel movement for a few days.  DIET: Your first meal following the procedure should be a light meal and then it is ok to progress to your normal diet.  A half-sandwich or bowl of soup is an example of a good first meal.  Heavy or fried foods are harder to digest and may make you feel nauseous or bloated.  Likewise meals heavy in dairy and vegetables can cause extra gas to form and this can also increase the bloating.  Drink plenty of fluids but you should avoid alcoholic beverages for 24 hours.  ACTIVITY: Your care partner should take you home directly after the procedure.  You should plan to take it easy, moving slowly for the rest of the day.  You can resume normal activity the day after the procedure however you should NOT DRIVE or use heavy machinery for 24 hours (because of the sedation medicines used during the test).    SYMPTOMS TO REPORT IMMEDIATELY: A gastroenterologist can be reached at any hour.  During normal business hours, 8:30 AM to 5:00 PM Monday through Friday,  call [phone].  After hours and on weekends, please call the GI answering service at [phone] who will take a message and have the physician on call contact you.   Following lower endoscopy (colonoscopy or flexible sigmoidoscopy):  Excessive amounts of blood in the stool  Significant tenderness or worsening of abdominal pains  Swelling of the abdomen that is new, acute  Fever of 100F or higher FOLLOW UP: If any biopsies were taken you will be contacted by phone or by letter within the next 1-3 weeks.  Call your gastroenterologist if you have not heard about the biopsies in 3 weeks.  Our staff will call the home number listed on your records the next business day following your procedure to check on you and address any questions or concerns that you may have at that time regarding the information given to you following your procedure. This is a courtesy call and so if there is no answer at the home number and we have not heard from you through the emergency physician on call, we will assume that you have returned to your regular daily activities without incident.  SIGNATURES/CONFIDENTIALITY: You and/or your care partner have signed paperwork which will be entered into your electronic medical record.  These signatures attest to the fact that that the information above on your After Visit Summary has been reviewed and is understood.  Full responsibility of the confidentiality of this discharge   information lies with you and/or your care-partner.  Polyp information given. 

## 2012-08-14 NOTE — Progress Notes (Signed)
Patient did not experience any of the following events: a burn prior to discharge; a fall within the facility; wrong site/side/patient/procedure/implant event; or a hospital transfer or hospital admission upon discharge from the facility. (G8907) Patient did not have preoperative order for IV antibiotic SSI prophylaxis. (G8918)  

## 2012-08-14 NOTE — Op Note (Signed)
[facility] [address] [place], [address]   COLONOSCOPY PROCEDURE REPORT  PATIENT: Christina Copeland, Christina Copeland  MR#: [id number] BIRTHDATE: 10-Jun-1951 , 61  yrs. old GENDER: Female ENDOSCOPIST: Roxy Cedar, MD REFERRED [id number] Program Recall PROCEDURE DATE:  08/14/2012 PROCEDURE:   Colonoscopy with snare polypectomy    x 2 ASA CLASS:   Class II INDICATIONS:patient's personal history of adenomatous colon polyps.  MEDICATIONS: MAC sedation, administered by CRNA and propofol (Diprivan) 250mg  IV  DESCRIPTION OF PROCEDURE:   After the risks benefits and alternatives of the procedure were thoroughly explained, informed consent was obtained.  A digital rectal exam revealed no abnormalities of the rectum.   The LB [id number]  endoscope was introduced through the anus and advanced to the cecum, which was identified by both the appendix and ileocecal valve. No adverse events experienced.   The quality of the prep was good, using MoviPrep  The instrument was then slowly withdrawn as the colon was fully examined.      COLON FINDINGS: Two diminutive polyps were found in the ascending colon and transverse colon.  A polypectomy was performed with a cold snare.  The resection was complete and the polyp tissue was completely retrieved.   The colon mucosa was otherwise normal. Retroflexed views revealed internal hemorrhoids. The time to cecum=2 minutes 50 seconds.  Withdrawal time=11 minutes 02 seconds. The scope was withdrawn and the procedure completed. COMPLICATIONS: There were no complications.  ENDOSCOPIC IMPRESSION: 1.   Two diminutive polyps were found in the ascending colon and transverse colon; polypectomy was performed with a cold snare 2.   The colon mucosa was otherwise normal  RECOMMENDATIONS: 1. Follow up colonoscopy in 5 years   eSigned:  Roxy Cedar, MD 08/14/2012 4:40 PM   cc: Corwin Levins, MD and The Patient   PATIENT NAME:   [name], [name] MR#: [id number]

## 2012-08-15 ENCOUNTER — Telehealth: Payer: Self-pay | Admitting: *Deleted

## 2012-08-15 NOTE — Telephone Encounter (Signed)
  Follow up Call-  Call back number 08/14/2012  Post procedure Call Back phone  # [phone]  Permission to leave phone message Yes     Patient questions:  Do you have a fever, pain , or abdominal swelling? no Pain Score  0 *  Have you tolerated food without any problems? yes  Have you been able to return to your normal activities? yes  Do you have any questions about your discharge instructions: Diet   no Medications  no Follow up visit  no  Do you have questions or concerns about your Care? no  Actions: * If pain score is 4 or above: No action needed, pain <4.

## 2012-08-19 ENCOUNTER — Encounter: Payer: Self-pay | Admitting: Internal Medicine

## 2012-08-20 ENCOUNTER — Encounter: Payer: Self-pay | Admitting: Internal Medicine

## 2012-08-20 ENCOUNTER — Ambulatory Visit (INDEPENDENT_AMBULATORY_CARE_PROVIDER_SITE_OTHER): Payer: Commercial Managed Care - PPO | Admitting: Internal Medicine

## 2012-08-20 VITALS — BP 120/92 | HR 70 | Temp 98.7°F | Ht 62.0 in | Wt 213.0 lb

## 2012-08-20 DIAGNOSIS — R3915 Urgency of urination: Secondary | ICD-10-CM

## 2012-08-20 DIAGNOSIS — B379 Candidiasis, unspecified: Secondary | ICD-10-CM

## 2012-08-20 DIAGNOSIS — E7800 Pure hypercholesterolemia, unspecified: Secondary | ICD-10-CM

## 2012-08-20 DIAGNOSIS — R300 Dysuria: Secondary | ICD-10-CM

## 2012-08-20 DIAGNOSIS — B49 Unspecified mycosis: Secondary | ICD-10-CM

## 2012-08-20 DIAGNOSIS — R350 Frequency of micturition: Secondary | ICD-10-CM

## 2012-08-20 MED ORDER — ATORVASTATIN CALCIUM 10 MG PO TABS: 10.0000 mg | ORAL_TABLET | Freq: Every day | ORAL | Status: DC

## 2012-08-20 MED ORDER — NITROFURANTOIN MONOHYD MACRO 100 MG PO CAPS: 100.0000 mg | ORAL_CAPSULE | Freq: Two times a day (BID) | ORAL | Status: DC

## 2012-08-20 MED ORDER — PHENAZOPYRIDINE HCL 200 MG PO TABS: 200.0000 mg | ORAL_TABLET | Freq: Three times a day (TID) | ORAL | Status: DC | PRN

## 2012-08-20 MED ORDER — FLUCONAZOLE 150 MG PO TABS: 150.0000 mg | ORAL_TABLET | Freq: Once | ORAL | Status: DC

## 2012-08-20 NOTE — Progress Notes (Signed)
Subjective:    Patient ID: Christina Copeland, female    DOB: [birth year], 61 y.o.   MRN: [id number]  HPI  Pt presents to the clinic today with c/o urinary symptoms. This started 5 days ago. She is experiencing pain with urination, urgency and frequency. She denies nausea, vomiting, fever, chills or back ache. The pain is 4/10, intermittent but stings more after she urinates. She thinks this is due to the fact that she also has a yeast infection. She developed the yeast infection after having a colonoscopy on 08/15/2012. She was given antibiotics and she typically always developed yeast infections with antibiotics. She says it is itchy on the outside of the vagina with a small amount of thick creamy discharge. The itching is constant and is usually worse when she is sitting still. She has not taken any OTC to treat this.   Review of Systems     Past Medical History  Diagnosis Date  . HYPERLIPIDEMIA 04/29/2007  . Overweight 04/29/2007  . ANEMIA-NOS 11/24/2007  . ANXIETY 04/29/2007  . DEPRESSION 04/29/2007  . CARPAL TUNNEL SYNDROME, BILATERAL 04/29/2007  . HYPERTENSION 04/29/2007  . ASTHMATIC BRONCHITIS, ACUTE 11/04/2008  . ALLERGIC RHINITIS 11/24/2007  . GERD 04/29/2007  . UTI 09/06/2009  . JOINT EFFUSION, LEFT KNEE 06/18/2008  . KNEE PAIN, RIGHT 06/18/2008  . BACK PAIN 09/06/2008  . COCCYGEAL PAIN 02/21/2010  . LEG PAIN, LEFT 09/06/2009  . HYPERSOMNIA 11/24/2007  . FATIGUE 11/24/2007  . Palpitations 09/13/2010  . DYSPNEA 11/16/2008  . CHEST PAIN 11/16/2008  . Other abnormal glucose 11/25/2007  . SPRAIN AND STRAIN OF LUMBOSACRAL 11/01/2009  . COLONIC POLYPS, HX OF 04/29/2007  . Impaired glucose tolerance 01/29/2011  . Vitamin D deficiency 01/02/2012    Current Outpatient Prescriptions  Medication Sig Dispense Refill  . ARIPiprazole (ABILIFY) 5 MG tablet Take by mouth. 1/2 by mouth daily      . aspirin 81 MG EC tablet Take 81 mg by mouth daily.        Marland Kitchen atorvastatin (LIPITOR) 10 MG tablet Take 1  tablet (10 mg total) by mouth daily.  90 tablet  3  . azithromycin (ZITHROMAX Z-PAK) 250 MG tablet Use as directed  6 each  1  . benzonatate (TESSALON PERLES) 100 MG capsule 1-2 tabs by mouth three times per day as needed for cough  90 capsule  1  . MOVIPREP 100 G SOLR Moviprep as directed / no substitutions  1 kit  0  . PARoxetine (PAXIL) 40 MG tablet Take 1 tablet (40 mg total) by mouth daily.  90 tablet  3    Allergies  Allergen Reactions  . Sulfonamide Derivatives     Family History  Problem Relation Age of Onset  . Cancer Other     breast and prostate  . Heart disease Other   . Colon cancer Maternal Uncle   . Heart disease Father   . Prostate cancer Father   . Breast cancer Maternal Aunt     History   Social History  . Marital Status: Married    Spouse Name: N/A    Number of Children: 2  . 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 Smoking status: Current Every Day Smoker -- 0.5 packs/day    Types: Cigarettes  . Smokeless tobacco: Never Used  . Alcohol Use: No  . Drug Use: No  . Sexually Active: Not on file   Other Topics  Concern  . Not on file   Social History Narrative   2 children , 1 autistic     Constitutional: Denies fever, malaise, fatigue, headache or abrupt weight changes.   GU: Pt reports urgency, frequency and pain with urination.  Pt also reports thick creamy discharge, and visible yeast on the outside of the vagina, which is very itchy. Denies burning sensation, blood in urine, odor or discharge. Skin: Denies redness, rashes, lesions or ulcercations.   No other specific complaints in a complete review of systems (except as listed in HPI above).    Objective:   Physical Exam  There were no vitals taken for this visit. Wt Readings from Last 3 Encounters:  08/14/12 215 lb (97.523 kg)  08/07/12 217 lb 6 oz (98.601 kg)  08/01/12 215 lb 6.4 oz (97.705 kg)    General: Appears her  stated age, well developed, well nourished in NAD. Cardiovascular: Normal rate and rhythm. S1,S2 noted.  No murmur, rubs or gallops noted. No JVD or BLE edema. No carotid bruits noted. Pulmonary/Chest: Normal effort and positive vesicular breath sounds. No respiratory distress. No wheezes, rales or ronchi noted.  Abdomen: Soft and nontender. Normal bowel sounds, no bruits noted. No distention or masses noted. Liver, spleen and kidneys non palpable. Tender to palpation over the bladder area. No CVA tenderness.      Assessment & Plan:   Urgency Frequency Dysuria Yeast infection  Urinalysis Wet prep + yeast eRx sent if for Macrobid 100 mg BID x 5 days eRx sent in for Pyridium 200 mg TID prn eRx given for Diflucan 150 mg  Drink plenty of fluids  RTC as needed or if symptoms persist.

## 2012-08-20 NOTE — Patient Instructions (Addendum)
Urinary Tract Infection Urinary tract infections (UTIs) can develop anywhere along your urinary tract. Your urinary tract is your body's drainage system for removing wastes and extra water. Your urinary tract includes two kidneys, two ureters, a bladder, and a urethra. Your kidneys are a pair of bean-shaped organs. Each kidney is about the size of your fist. They are located below your ribs, one on each side of your spine. CAUSES Infections are caused by microbes, which are microscopic organisms, including fungi, viruses, and bacteria. These organisms are so small that they can only be seen through a microscope. Bacteria are the microbes that most commonly cause UTIs. SYMPTOMS  Symptoms of UTIs may vary by age and gender of the patient and by the location of the infection. Symptoms in young women typically include a frequent and intense urge to urinate and a painful, burning feeling in the bladder or urethra during urination. Older women and men are more likely to be tired, shaky, and weak and have muscle aches and abdominal pain. A fever may mean the infection is in your kidneys. Other symptoms of a kidney infection include pain in your back or sides below the ribs, nausea, and vomiting. DIAGNOSIS To diagnose a UTI, your caregiver will ask you about your symptoms. Your caregiver also will ask to provide a urine sample. The urine sample will be tested for bacteria and white blood cells. White blood cells are made by your body to help fight infection. TREATMENT  Typically, UTIs can be treated with medication. Because most UTIs are caused by a bacterial infection, they usually can be treated with the use of antibiotics. The choice of antibiotic and length of treatment depend on your symptoms and the type of bacteria causing your infection. HOME CARE INSTRUCTIONS  If you were prescribed antibiotics, take them exactly as your caregiver instructs you. Finish the medication even if you feel better after you  have only taken some of the medication.  Drink enough water and fluids to keep your urine clear or pale yellow.  Avoid caffeine, tea, and carbonated beverages. They tend to irritate your bladder.  Empty your bladder often. Avoid holding urine for long periods of time.  Empty your bladder before and after sexual intercourse.  After a bowel movement, women should cleanse from front to back. Use each tissue only once. SEEK MEDICAL CARE IF:   You have back pain.  You develop a fever.  Your symptoms do not begin to resolve within 3 days. SEEK IMMEDIATE MEDICAL CARE IF:   You have severe back pain or lower abdominal pain.  You develop chills.  You have nausea or vomiting.  You have continued burning or discomfort with urination. MAKE SURE YOU:   Understand these instructions.  Will watch your condition.  Will get help right away if you are not doing well or get worse. Document Released: 07/04/2005 Document Revised: 03/25/2012 Document Reviewed: 11/02/2011 Exit[employer] Patient Information 2013 [employer].  

## 2012-08-25 ENCOUNTER — Ambulatory Visit (HOSPITAL_COMMUNITY)
Admission: RE | Admit: 2012-08-25 | Discharge: 2012-08-25 | Disposition: A | Discharge status: Home / Self Care | Payer: Commercial Managed Care - PPO | Source: Ambulatory Visit | Attending: Internal Medicine | Admitting: Internal Medicine

## 2012-08-25 DIAGNOSIS — Z1231 Encounter for screening mammogram for malignant neoplasm of breast: Secondary | ICD-10-CM | POA: Insufficient documentation

## 2012-08-26 ENCOUNTER — Ambulatory Visit (INDEPENDENT_AMBULATORY_CARE_PROVIDER_SITE_OTHER): Payer: Commercial Managed Care - PPO | Admitting: Obstetrics & Gynecology

## 2012-08-26 ENCOUNTER — Encounter: Payer: Self-pay | Admitting: Obstetrics & Gynecology

## 2012-08-26 VITALS — BP 131/87 | HR 75 | Ht 61.0 in | Wt 212.8 lb

## 2012-08-26 DIAGNOSIS — Z01419 Encounter for gynecological examination (general) (routine) without abnormal findings: Secondary | ICD-10-CM

## 2012-08-26 DIAGNOSIS — N760 Acute vaginitis: Secondary | ICD-10-CM

## 2012-08-26 DIAGNOSIS — Z124 Encounter for screening for malignant neoplasm of cervix: Secondary | ICD-10-CM

## 2012-08-26 DIAGNOSIS — Z1151 Encounter for screening for human papillomavirus (HPV): Secondary | ICD-10-CM

## 2012-08-26 NOTE — Patient Instructions (Signed)
Preventive Care for Adults, Female A healthy lifestyle and preventive care can promote health and wellness. Preventive health guidelines for women include the following key practices.  A routine yearly physical is a good way to check with your caregiver about your health and preventive screening. It is a chance to share any concerns and updates on your health, and to receive a thorough exam.  Visit your dentist for a routine exam and preventive care every 6 months. Brush your teeth twice a day and floss once a day. Good oral hygiene prevents tooth decay and gum disease.  The frequency of eye exams is based on your age, health, family medical history, use of contact lenses, and other factors. Follow your caregiver's recommendations for frequency of eye exams.  Eat a healthy diet. Foods like vegetables, fruits, whole grains, low-fat dairy products, and lean protein foods contain the nutrients you need without too many calories. Decrease your intake of foods high in solid fats, added sugars, and salt. Eat the right amount of calories for you.Get information about a proper diet from your caregiver, if necessary.  Regular physical exercise is one of the most important things you can do for your health. Most adults should get at least 150 minutes of moderate-intensity exercise (any activity that increases your heart rate and causes you to sweat) each week. In addition, most adults need muscle-strengthening exercises on 2 or more days a week.  Maintain a healthy weight. The body mass index (BMI) is a screening tool to identify possible weight problems. It provides an estimate of body fat based on height and weight. Your caregiver can help determine your BMI, and can help you achieve or maintain a healthy weight.For adults 20 years and older:  A BMI below 18.5 is considered underweight.  A BMI of 18.5 to 24.9 is normal.  A BMI of 25 to 29.9 is considered overweight.  A BMI of 30 and above is  considered obese.  Maintain normal blood lipids and cholesterol levels by exercising and minimizing your intake of saturated fat. Eat a balanced diet with plenty of fruit and vegetables. Blood tests for lipids and cholesterol should begin at age 41 and be repeated every 5 years. If your lipid or cholesterol levels are high, you are over 50, or you are at high risk for heart disease, you may need your cholesterol levels checked more frequently.Ongoing high lipid and cholesterol levels should be treated with medicines if diet and exercise are not effective.  If you smoke, find out from your caregiver how to quit. If you do not use tobacco, do not start.  If you are pregnant, do not drink alcohol. If you are breastfeeding, be very cautious about drinking alcohol. If you are not pregnant and choose to drink alcohol, do not exceed 1 drink per day. One drink is considered to be 12 ounces (355 mL) of beer, 5 ounces (148 mL) of wine, or 1.5 ounces (44 mL) of liquor.  Avoid use of street drugs. Do not share needles with anyone. Ask for help if you need support or instructions about stopping the use of drugs.  High blood pressure causes heart disease and increases the risk of stroke. Your blood pressure should be checked at least every 1 to 2 years. Ongoing high blood pressure should be treated with medicines if weight loss and exercise are not effective.  If you are 65 to 60 years old, ask your caregiver if you should take aspirin to prevent strokes.  Diabetes  screening involves taking a blood sample to check your fasting blood sugar level. This should be done once every 3 years, after age 45, if you are within normal weight and without risk factors for diabetes. Testing should be considered at a younger age or be carried out more frequently if you are overweight and have at least 1 risk factor for diabetes.  Breast cancer screening is essential preventive care for women. You should practice "breast  self-awareness." This means understanding the normal appearance and feel of your breasts and may include breast self-examination. Any changes detected, no matter how small, should be reported to a caregiver. Women in their 20s and 30s should have a clinical breast exam (CBE) by a caregiver as part of a regular health exam every 1 to 3 years. After age 40, women should have a CBE every year. Starting at age 40, women should consider having a mammography (breast X-ray test) every year. Women who have a family history of breast cancer should talk to their caregiver about genetic screening. Women at a high risk of breast cancer should talk to their caregivers about having magnetic resonance imaging (MRI) and a mammography every year.  The Pap test is a screening test for cervical cancer. A Pap test can show cell changes on the cervix that might become cervical cancer if left untreated. A Pap test is a procedure in which cells are obtained and examined from the lower end of the uterus (cervix).  Women should have a Pap test starting at age 21.  Between ages 21 and 29, Pap tests should be repeated every 2 years.  Beginning at age 30, you should have a Pap test every 3 years as long as the past 3 Pap tests have been normal.  Some women have medical problems that increase the chance of getting cervical cancer. Talk to your caregiver about these problems. It is especially important to talk to your caregiver if a new problem develops soon after your last Pap test. In these cases, your caregiver may recommend more frequent screening and Pap tests.  The above recommendations are the same for women who have or have not gotten the vaccine for human papillomavirus (HPV).  If you had a hysterectomy for a problem that was not cancer or a condition that could lead to cancer, then you no longer need Pap tests. Even if you no longer need a Pap test, a regular exam is a good idea to make sure no other problems are  starting.  If you are between ages 65 and 70, and you have had normal Pap tests going back 10 years, you no longer need Pap tests. Even if you no longer need a Pap test, a regular exam is a good idea to make sure no other problems are starting.  If you have had past treatment for cervical cancer or a condition that could lead to cancer, you need Pap tests and screening for cancer for at least 20 years after your treatment.  If Pap tests have been discontinued, risk factors (such as a new sexual partner) need to be reassessed to determine if screening should be resumed.  The HPV test is an additional test that may be used for cervical cancer screening. The HPV test looks for the virus that can cause the cell changes on the cervix. The cells collected during the Pap test can be tested for HPV. The HPV test could be used to screen women aged 30 years and older, and should   be used in women of any age who have unclear Pap test results. After the age of 30, women should have HPV testing at the same frequency as a Pap test.  Colorectal cancer can be detected and often prevented. Most routine colorectal cancer screening begins at the age of 50 and continues through age 75. However, your caregiver may recommend screening at an earlier age if you have risk factors for colon cancer. On a yearly basis, your caregiver may provide home test kits to check for hidden blood in the stool. Use of a small camera at the end of a tube, to directly examine the colon (sigmoidoscopy or colonoscopy), can detect the earliest forms of colorectal cancer. Talk to your caregiver about this at age 50, when routine screening begins. Direct examination of the colon should be repeated every 5 to 10 years through age 75, unless early forms of pre-cancerous polyps or small growths are found.  Hepatitis C blood testing is recommended for all people born from 1945 through 1965 and any individual with known risks for hepatitis C.  Practice  safe sex. Use condoms and avoid high-risk sexual practices to reduce the spread of sexually transmitted infections (STIs). STIs include gonorrhea, chlamydia, syphilis, trichomonas, herpes, HPV, and human immunodeficiency virus (HIV). Herpes, HIV, and HPV are viral illnesses that have no cure. They can result in disability, cancer, and death. Sexually active women aged 25 and younger should be checked for chlamydia. Older women with new or multiple partners should also be tested for chlamydia. Testing for other STIs is recommended if you are sexually active and at increased risk.  Osteoporosis is a disease in which the bones lose minerals and strength with aging. This can result in serious bone fractures. The risk of osteoporosis can be identified using a bone density scan. Women ages 65 and over and women at risk for fractures or osteoporosis should discuss screening with their caregivers. Ask your caregiver whether you should take a calcium supplement or vitamin D to reduce the rate of osteoporosis.  Menopause can be associated with physical symptoms and risks. Hormone replacement therapy is available to decrease symptoms and risks. You should talk to your caregiver about whether hormone replacement therapy is right for you.  Use sunscreen with sun protection factor (SPF) of 30 or more. Apply sunscreen liberally and repeatedly throughout the day. You should seek shade when your shadow is shorter than you. Protect yourself by wearing long sleeves, pants, a wide-brimmed hat, and sunglasses year round, whenever you are outdoors.  Once a month, do a whole body skin exam, using a mirror to look at the skin on your back. Notify your caregiver of new moles, moles that have irregular borders, moles that are larger than a pencil eraser, or moles that have changed in shape or color.  Stay current with required immunizations.  Influenza. You need a dose every fall (or winter). The composition of the flu vaccine  changes each year, so being vaccinated once is not enough.  Pneumococcal polysaccharide. You need 1 to 2 doses if you smoke cigarettes or if you have certain chronic medical conditions. You need 1 dose at age 65 (or older) if you have never been vaccinated.  Tetanus, diphtheria, pertussis (Tdap, Td). Get 1 dose of Tdap vaccine if you are younger than age 65, are over 65 and have contact with an infant, are a healthcare worker, are pregnant, or simply want to be protected from whooping cough. After that, you need a Td   booster dose every 10 years. Consult your caregiver if you have not had at least 3 tetanus and diphtheria-containing shots sometime in your life or have a deep or dirty wound.  HPV. You need this vaccine if you are a woman age 26 or younger. The vaccine is given in 3 doses over 6 months.  Measles, mumps, rubella (MMR). You need at least 1 dose of MMR if you were born in 1957 or later. You may also need a second dose.  Meningococcal. If you are age 19 to 21 and a first-year college student living in a residence hall, or have one of several medical conditions, you need to get vaccinated against meningococcal disease. You may also need additional booster doses.  Zoster (shingles). If you are age 60 or older, you should get this vaccine.  Varicella (chickenpox). If you have never had chickenpox or you were vaccinated but received only 1 dose, talk to your caregiver to find out if you need this vaccine.  Hepatitis A. You need this vaccine if you have a specific risk factor for hepatitis A virus infection or you simply wish to be protected from this disease. The vaccine is usually given as 2 doses, 6 to 18 months apart.  Hepatitis B. You need this vaccine if you have a specific risk factor for hepatitis B virus infection or you simply wish to be protected from this disease. The vaccine is given in 3 doses, usually over 6 months. Preventive Services / Frequency Ages 19 to 39  Blood  pressure check.** / Every 1 to 2 years.  Lipid and cholesterol check.** / Every 5 years beginning at age 20.  Clinical breast exam.** / Every 3 years for women in their 20s and 30s.  Pap test.** / Every 2 years from ages 21 through 29. Every 3 years starting at age 30 through age 65 or 70 with a history of 3 consecutive normal Pap tests.  HPV screening.** / Every 3 years from ages 30 through ages 65 to 70 with a history of 3 consecutive normal Pap tests.  Hepatitis C blood test.** / For any individual with known risks for hepatitis C.  Skin self-exam. / Monthly.  Influenza immunization.** / Every year.  Pneumococcal polysaccharide immunization.** / 1 to 2 doses if you smoke cigarettes or if you have certain chronic medical conditions.  Tetanus, diphtheria, pertussis (Tdap, Td) immunization. / A one-time dose of Tdap vaccine. After that, you need a Td booster dose every 10 years.  HPV immunization. / 3 doses over 6 months, if you are 26 and younger.  Measles, mumps, rubella (MMR) immunization. / You need at least 1 dose of MMR if you were born in 1957 or later. You may also need a second dose.  Meningococcal immunization. / 1 dose if you are age 19 to 21 and a first-year college student living in a residence hall, or have one of several medical conditions, you need to get vaccinated against meningococcal disease. You may also need additional booster doses.  Varicella immunization.** / Consult your caregiver.  Hepatitis A immunization.** / Consult your caregiver. 2 doses, 6 to 18 months apart.  Hepatitis B immunization.** / Consult your caregiver. 3 doses usually over 6 months. Ages 40 to 64  Blood pressure check.** / Every 1 to 2 years.  Lipid and cholesterol check.** / Every 5 years beginning at age 20.  Clinical breast exam.** / Every year after age 40.  Mammogram.** / Every year beginning at age 40   and continuing for as long as you are in good health. Consult with your  caregiver.  Pap test.** / Every 3 years starting at age 30 through age 65 or 70 with a history of 3 consecutive normal Pap tests.  HPV screening.** / Every 3 years from ages 30 through ages 65 to 70 with a history of 3 consecutive normal Pap tests.  Fecal occult blood test (FOBT) of stool. / Every year beginning at age 50 and continuing until age 75. You may not need to do this test if you get a colonoscopy every 10 years.  Flexible sigmoidoscopy or colonoscopy.** / Every 5 years for a flexible sigmoidoscopy or every 10 years for a colonoscopy beginning at age 50 and continuing until age 75.  Hepatitis C blood test.** / For all people born from 1945 through 1965 and any individual with known risks for hepatitis C.  Skin self-exam. / Monthly.  Influenza immunization.** / Every year.  Pneumococcal polysaccharide immunization.** / 1 to 2 doses if you smoke cigarettes or if you have certain chronic medical conditions.  Tetanus, diphtheria, pertussis (Tdap, Td) immunization.** / A one-time dose of Tdap vaccine. After that, you need a Td booster dose every 10 years.  Measles, mumps, rubella (MMR) immunization. / You need at least 1 dose of MMR if you were born in 1957 or later. You may also need a second dose.  Varicella immunization.** / Consult your caregiver.  Meningococcal immunization.** / Consult your caregiver.  Hepatitis A immunization.** / Consult your caregiver. 2 doses, 6 to 18 months apart.  Hepatitis B immunization.** / Consult your caregiver. 3 doses, usually over 6 months. Ages 65 and over  Blood pressure check.** / Every 1 to 2 years.  Lipid and cholesterol check.** / Every 5 years beginning at age 20.  Clinical breast exam.** / Every year after age 40.  Mammogram.** / Every year beginning at age 40 and continuing for as long as you are in good health. Consult with your caregiver.  Pap test.** / Every 3 years starting at age 30 through age 65 or 70 with a 3  consecutive normal Pap tests. Testing can be stopped between 65 and 70 with 3 consecutive normal Pap tests and no abnormal Pap or HPV tests in the past 10 years.  HPV screening.** / Every 3 years from ages 30 through ages 65 or 70 with a history of 3 consecutive normal Pap tests. Testing can be stopped between 65 and 70 with 3 consecutive normal Pap tests and no abnormal Pap or HPV tests in the past 10 years.  Fecal occult blood test (FOBT) of stool. / Every year beginning at age 50 and continuing until age 75. You may not need to do this test if you get a colonoscopy every 10 years.  Flexible sigmoidoscopy or colonoscopy.** / Every 5 years for a flexible sigmoidoscopy or every 10 years for a colonoscopy beginning at age 50 and continuing until age 75.  Hepatitis C blood test.** / For all people born from 1945 through 1965 and any individual with known risks for hepatitis C.  Osteoporosis screening.** / A one-time screening for women ages 65 and over and women at risk for fractures or osteoporosis.  Skin self-exam. / Monthly.  Influenza immunization.** / Every year.  Pneumococcal polysaccharide immunization.** / 1 dose at age 65 (or older) if you have never been vaccinated.  Tetanus, diphtheria, pertussis (Tdap, Td) immunization. / A one-time dose of Tdap vaccine if you are over   65 and have contact with an infant, are a Research scientist (physical sciences), or simply want to be protected from whooping cough. After that, you need a Td booster dose every 10 years.  Varicella immunization.** / Consult your caregiver.  Meningococcal immunization.** / Consult your caregiver.  Hepatitis A immunization.** / Consult your caregiver. 2 doses, 6 to 18 months apart.  Hepatitis B immunization.** / Check with your caregiver. 3 doses, usually over 6 months. ** Family history and personal history of risk and conditions may change your caregiver's recommendations. Document Released: 11/20/2001 Document Revised: 12/17/2011  Document Reviewed: 02/19/2011 [facility] Patient Information 2013 [place], [place].  Thank you for enrolling in MyChart. Please follow the instructions below to securely access your online medical record. MyChart allows you to send messages to your doctor, view your test results, manage appointments, and more.   How Do I Sign Up? 1. In your Internet browser, go to [employer] and enter [email or web address]. 2. Click on the Sign Up Now link in the Sign In box. You will see the New Member Sign Up page. 3. Enter your MyChart Access Code exactly as it appears below. You will not need to use this code after you've completed the sign-up process. If you do not sign up before the expiration date, you must request a new code. MyChart Access Code: [email or web address] Expires: 09/06/2012 11:02 AM  4. Enter your Social Security Number ([id number]) and Date of Birth (mm/dd/yyyy) as indicated and click Submit. You will be taken to the next sign-up page. 5. Create a MyChart ID. This will be your MyChart login ID and cannot be changed, so think of one that is secure and easy to remember. 6. Create a MyChart password. You can change your password at any time. 7. Enter your Password Reset Question and Answer. This can be used at a later time if you forget your password.  8. Enter your e-mail address. You will receive e-mail notification when new information is available in MyChart. 9. Click Sign Up. You can now view your medical record.   Additional Information Remember, MyChart is NOT to be used for urgent needs. For medical emergencies, dial 911.

## 2012-08-26 NOTE — Progress Notes (Signed)
  Subjective:     Christina Copeland is a 61 y.o. [id number] PMP female and is here for a comprehensive gynecologic exam and for evaluation of vaginal discharge causing vulvar irritation and itching. The patient reports she has not had a pap smear in seven years, but had mammogram yesterday (08/25/12, report pending), had a recent colonoscopy and uptodate on other routine preventative health maintenance measures with the help of her PCP, Dr. Oliver Barre.  Vaginal discharge and vulvar itching started 10 days ago. Reports thick, white discharge association with itching and burning during urination. Had a negative UA at her PCP's office, was given Diflucan x 1 dose which did not help her symptoms. No noted lesions or other associated symptoms.   History   Social History  . Marital Status: Married    Spouse Name: N/A    Number of Children: 2  . 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 Smoking status: Current Every Day Smoker -- 0.5 packs/day    Types: Cigarettes  . Smokeless tobacco: Never Used  . Alcohol Use: No  . Drug Use: No  . Sexually Active: Yes    Birth Control/ Protection: Post-menopausal   Other Topics Concern  . Not on file   Social History Narrative   2 children , 1 autistic   Health Maintenance  Topic Date Due  . Pap Smear  03/29/1970  . Zostavax  03/29/2012  . Influenza Vaccine  06/08/2012  . Mammogram  08/23/2013  . Tetanus/tdap  10/09/2015  . Colonoscopy  08/14/2017    The following portions of the patient's history were reviewed and updated as appropriate: allergies, current medications, past family history, past medical history, past social history, past surgical history and problem list.  Review of Systems Pertinent items are noted in HPI.   Objective:    Blood pressure 131/87, pulse 75, height 5\' 1"  (1.549 m), weight 212 lb 12.8 oz (96.525 kg). GENERAL: Well-developed, well-nourished female in no  acute distress.  HEENT: Normocephalic, atraumatic. Sclerae anicteric.  NECK: Supple. Normal thyroid.  LUNGS: Clear to auscultation bilaterally.  HEART: Regular rate and rhythm. BREASTS: Symmetric in size. No masses, skin changes, nipple drainage, or lymphadenopathy. ABDOMEN: Soft, obese, well-healed surgical scars, nontender, nondistended. No organomegaly. PELVIC: Normal external female genitalia, no lesions or loss of architecture noted. Vagina is pink and rugated.  White discharge noted. Normal cervix contour. Pap smear sample obtained; will do ancillary testing for candida and BV. Unable to palpate uterus and adnexa due to habitus but no tenderness on bimanual exam. EXTREMITIES: No cyanosis, clubbing, or edema, 2+ distal pulses.   Assessment:    Healthy female exam.  Vulvovaginitis     Plan:   Pap and ancillary testing done, will follow up results and manage accordingly. Recommended OTC hydrocortisone cream externally for itching; also emphasized proper vulvar hygiene Routine preventative health maintenance measures emphasized

## 2012-08-26 NOTE — Progress Notes (Signed)
Here today for c/o white vaginal discharge, intermittent buring, and swelling of perineal area x 10 days. States went to Dr. Jonny Ruiz, internal medicince doctor and he gave diflucan x1 for yeast and prescribed medicine for uti that she did not take because test was negative for uti.  States still having vaginal discharge, burning and swelling. States has not been to a gynecologist or had a papsmear in 7 years.

## 2012-09-02 ENCOUNTER — Other Ambulatory Visit: Payer: Self-pay | Admitting: Internal Medicine

## 2012-09-11 ENCOUNTER — Encounter: Payer: Commercial Managed Care - PPO | Admitting: Obstetrics & Gynecology

## 2012-09-24 ENCOUNTER — Ambulatory Visit: Payer: Commercial Managed Care - PPO | Admitting: Internal Medicine

## 2012-10-28 ENCOUNTER — Other Ambulatory Visit: Payer: Self-pay | Admitting: Internal Medicine

## 2012-10-28 NOTE — Telephone Encounter (Signed)
Done erx 

## 2013-03-03 ENCOUNTER — Telehealth: Payer: Self-pay | Admitting: *Deleted

## 2013-03-03 NOTE — Telephone Encounter (Signed)
Left msg on triage currently ut [place] town in [place]. Left her BP med and cholesterol medication. Haven't had meds in 3 days starting to get a headache.  Requesting md to call pharmacy there @ [phone] to refill meds...lmb

## 2013-03-03 NOTE — Telephone Encounter (Signed)
Called pt bck inform her call [facility] had to leave msg on Dr. Pearla Dubonnet refill lipitor & losartan...lmb

## 2013-03-04 ENCOUNTER — Other Ambulatory Visit: Payer: Self-pay

## 2013-03-04 DIAGNOSIS — E7800 Pure hypercholesterolemia, unspecified: Secondary | ICD-10-CM

## 2013-03-04 MED ORDER — ATORVASTATIN CALCIUM 10 MG PO TABS: 10.0000 mg | ORAL_TABLET | Freq: Every day | ORAL | Status: DC

## 2013-03-04 MED ORDER — LOSARTAN POTASSIUM 100 MG PO TABS: 100.0000 mg | ORAL_TABLET | Freq: Every day | ORAL | Status: DC

## 2013-09-09 ENCOUNTER — Other Ambulatory Visit: Payer: Self-pay | Admitting: Internal Medicine

## 2013-10-13 ENCOUNTER — Telehealth: Payer: Self-pay | Admitting: *Deleted

## 2013-10-13 NOTE — Telephone Encounter (Signed)
Ok to cont the lipitor/cozaar if she has made an appt for later this month; her other meds I think come from psychiatry

## 2013-10-13 NOTE — Telephone Encounter (Signed)
Patient phoned requesting her meds be refilled, but states she cannot come for an appointment until the end of January.  She is going to schedule an appointment and call back tomorrow.  I explained to her that per our refill protocol, I could not refill her meds without MD authorization and reminded her that she had been advised of this protocol & the need for an appointment before anymore meds could be refilled.

## 2013-10-14 ENCOUNTER — Other Ambulatory Visit: Payer: Self-pay | Admitting: *Deleted

## 2013-10-14 DIAGNOSIS — E7800 Pure hypercholesterolemia, unspecified: Secondary | ICD-10-CM

## 2013-10-14 MED ORDER — ATORVASTATIN CALCIUM 10 MG PO TABS: 10.0000 mg | ORAL_TABLET | Freq: Every day | ORAL | Status: DC

## 2013-10-14 MED ORDER — LOSARTAN POTASSIUM 100 MG PO TABS: 100.0000 mg | ORAL_TABLET | Freq: Every day | ORAL | Status: DC

## 2013-10-14 NOTE — Telephone Encounter (Signed)
Patient notified of two meds & pharmacy in [place] she needed them sent to verified.

## 2013-11-03 ENCOUNTER — Other Ambulatory Visit: Payer: PPO | Attending: Family Medicine | Admitting: Family Medicine

## 2013-11-03 ENCOUNTER — Encounter (INDEPENDENT_AMBULATORY_CARE_PROVIDER_SITE_OTHER): Payer: Self-pay | Admitting: Family Medicine

## 2013-11-03 VITALS — BP 132/81 | HR 71 | Temp 98.2°F | Ht 61.81 in | Wt 229.0 lb

## 2013-11-03 DIAGNOSIS — F411 Generalized anxiety disorder: Secondary | ICD-10-CM | POA: Insufficient documentation

## 2013-11-03 DIAGNOSIS — F419 Anxiety disorder, unspecified: Secondary | ICD-10-CM

## 2013-11-03 DIAGNOSIS — F32A Depression, unspecified: Secondary | ICD-10-CM | POA: Insufficient documentation

## 2013-11-03 DIAGNOSIS — R300 Dysuria: Principal | ICD-10-CM | POA: Insufficient documentation

## 2013-11-03 DIAGNOSIS — E785 Hyperlipidemia, unspecified: Secondary | ICD-10-CM | POA: Insufficient documentation

## 2013-11-03 DIAGNOSIS — F329 Major depressive disorder, single episode, unspecified: Secondary | ICD-10-CM

## 2013-11-03 DIAGNOSIS — I10 Essential (primary) hypertension: Secondary | ICD-10-CM | POA: Insufficient documentation

## 2013-11-03 DIAGNOSIS — F3289 Other specified depressive episodes: Secondary | ICD-10-CM | POA: Insufficient documentation

## 2013-11-03 LAB — URINALYSIS
Bilirubin: NEGATIVE
Blood: NEGATIVE
Glucose: NEGATIVE
Ketones: NEGATIVE
Nitrite: NEGATIVE
Protein: NEGATIVE
Specific Gravity: 1.025 (ref 1.002–1.030)
pH: 5 (ref 5.0–8.0)

## 2013-11-03 MED ORDER — ATORVASTATIN CALCIUM 10 MG OR TABS
10.00 mg | ORAL_TABLET | Freq: Every day | ORAL | Status: DC
Start: ? — End: 2013-11-03

## 2013-11-03 MED ORDER — LOSARTAN POTASSIUM 100 MG OR TABS
100.0000 mg | ORAL_TABLET | Freq: Every day | ORAL | Status: DC
Start: 2013-11-03 — End: 2014-01-27

## 2013-11-03 MED ORDER — ATORVASTATIN CALCIUM 10 MG OR TABS
10.0000 mg | ORAL_TABLET | Freq: Every day | ORAL | Status: DC
Start: 2013-11-03 — End: 2014-01-27

## 2013-11-03 MED ORDER — PAROXETINE HCL 40 MG OR TABS
40.00 mg | ORAL_TABLET | Freq: Every day | ORAL | Status: DC
Start: ? — End: 2013-11-03

## 2013-11-03 MED ORDER — ARIPIPRAZOLE 5 MG OR TABS
2.5000 mg | ORAL_TABLET | Freq: Every day | ORAL | Status: DC
Start: 2013-11-03 — End: 2014-01-27

## 2013-11-03 MED ORDER — ARIPIPRAZOLE 5 MG OR TABS
2.50 mg | ORAL_TABLET | Freq: Every day | ORAL | Status: DC
Start: ? — End: 2013-11-03

## 2013-11-03 MED ORDER — LOSARTAN POTASSIUM 100 MG OR TABS
100.00 mg | ORAL_TABLET | Freq: Every day | ORAL | Status: DC
Start: ? — End: 2013-11-03

## 2013-11-03 MED ORDER — PAROXETINE HCL 40 MG OR TABS
40.0000 mg | ORAL_TABLET | Freq: Every day | ORAL | Status: DC
Start: 2013-11-03 — End: 2014-01-13

## 2013-11-03 NOTE — Progress Notes (Signed)
ATTENDING NOTE:    Resident's history reviewed, patient interviewed and examined.    SUBJECTIVE:   Briefly, history is as follows:  Kristina Snyder is a 63 year old female who presents with chief complaint of New Patient    Needs refills of meds.   Also to establish care.   Dysuria for last month.   No flank or abdominal pain.       ROS:  Please refer to resident note.    No future appointments.  Review of Systems (ROS): As per the resident's note.  Past Medical, Family, Social History:  As per  the resident's  note.    Patient Active Problem List    Diagnosis Date Noted    Hypertension 11/03/2013    Hyperlipidemia 11/03/2013    Depression 11/03/2013    Anxiety 11/03/2013     No current outpatient prescriptions on file prior to visit.     No current facility-administered medications on file prior to visit.     Allergies as of 11/03/2013    (No Known Allergies)       There is no immunization history on file for this patient.  ====================================================================================================  OBJECTIVE:   Filed Vitals:    11/03/13 0931   BP: 132/81   Pulse: 71   Temp: 98.2 F (36.8 C)   TempSrc: Oral   Height: 5' 1.81" (1.57 m)   Weight: 103.874 kg (229 lb)     Estimated body mass index is 42.14 kg/(m^2) as calculated from the following:    Height as of this encounter: 5' 1.81" (1.57 m).    Weight as of this encounter: 103.874 kg (229 lb).  I have examined the patient and I concur with the resident's exam and of note is:  Gen:  WDWN female in NAD, alert, oriented, answering questions appropriately, normal affect  Neck:  no lymphadenopathy, no thyromegaly, but lower anterior neck fullness is noted, no jugular venous distension, no carotid bruits  Heart:  regular rate and rhythm, no murmurs, gallops or rubs  Lungs:  clear to auscultation bilaterally      ====================================================================================================  MEDICAL PLAN of  CARE:     Assessment and plan reviewed with the resident physician.    Lab tests show:  none.    I agree with the resident's plan as documented.    See the resident's note for further details.  Discharge Meds:  Current Outpatient Prescriptions   Medication Sig    ARIPiprazole (ABILIFY) 5 MG tablet Take 2.5 mg by mouth daily.    atorvastatin (LIPITOR) 10 MG tablet Take 10 mg by mouth daily.    losartan (COZAAR) 100 MG tablet Take 100 mg by mouth daily.    PARoxetine (PAXIL) 40 MG tablet Take 40 mg by mouth daily.     No current facility-administered medications for this visit.     No orders of the defined types were placed in this encounter.

## 2013-11-03 NOTE — Patient Instructions (Signed)
My name is Lysle Rubens and I was the Medical Assistant that was caring for you today.  If you have any questions or concerns regarding today's visit, please feel free to give me a call at [phone].  If you receive a survey in the mail, we would appreciate your feedback.    Thank you for choosing [facility] for all your healthcare needs.

## 2013-11-03 NOTE — Interdisciplinary (Signed)
Pre-visit chart review and huddle completed with staff and physician.    Outstanding labs, imaging and consults reviewed and identified.    Health maintanence issues identified and addressed:    Health Maintenance   Topic Date Due   . Imm_td/tdap=>63 Yo  03/30/1962   . Cervical Cancer Screening  03/29/1973   . Breast Cancer Screening  03/29/1992   . Influenza Vaccine  06/08/2013

## 2013-11-03 NOTE — Progress Notes (Signed)
Family Medicine Clinic Progress Note    CC: establish care, med refills    Subjective: Kristina Snyder is a 63 year old female with history of hypertension, depression, hyperlipidemia who presents to establish care and obtain med refills: Paxil (20 years), Abilify (1-2 years, helped with anxiety attacks), Losartan (20 years), Lipitor (5 years)    Overall doing well.     Only concern is that has some dysuria for a month or so, no fevers, chills, nausea, vomiting    Lives with husband and youngest son who is autistic.   Originally from [place] has been here many years    Patient Active Problem List   Diagnosis    Hypertension    Hyperlipidemia    Depression    Anxiety       No outpatient prescriptions prior to visit.     No facility-administered medications prior to visit.       There is no immunization history on file for this patient.  No Known Allergies    Gen: no fevers, chills, weight loss  Kidney + dysuria, no hematuria, changes in urination  Lungs: no cough, SOB, hemoptysis  Stomach: no bloody stools, no abdominal pain, nausea, vomiting  Neurologic: no weakness, tingling, problems walking or fainting  Vascular: no leg pain with walking, painful hands or digits  Oral: no bleeding from gums or sores  Heart: no chest pain or irregular heart beat  Psychiatric: + depression history (but mood is good), no behavior changes  MSK no joint pain or leg swelling  ENT: no nose bleeds  Skin: no rashes  Blood: no bleeding or bruising  Endo: no feeling hot or cold      Objective:  Filed Vitals:    11/03/13 0931   BP: 132/81   Pulse: 71   Temp: 98.2 F (36.8 C)   TempSrc: Oral   Height: 5' 1.81" (1.57 m)   Weight: 103.874 kg (229 lb)     Estimated body mass index is 42.14 kg/(m^2) as calculated from the following:    Height as of this encounter: 5' 1.81" (1.57 m).    Weight as of this encounter: 103.874 kg (229 lb).    Physical Examination  General Appearance: obese female, alert, no distress, pleasant affect,  cooperative.  Neck: good ROM, general fullness without palpable thyroid abnormality    Heart:  normal rate and regular rhythm, no murmurs, clicks, or gallops.  Lungs: clear to auscultation.  Abdomen: Abdomen soft, non-tender. No masses or organomegaly. Bowel sounds normal.    [place] PHQ9 DEPRESSION QUESTIONNAIRE 11/03/2013   Interest 1   Depressed 1   Sleep 3   Energy 1   Appetite 3   Failure 0   Concentration 2   Movement 0   Suicide 0   Summary(Manual) --   Summary(Calculated) 11   Functional 1     GAD 7 11/03/2013   Feeling afraid as if something awful might happen 2   Being easily annoyed or irritable 0   Worrying too much about different things 2   If you checked off any problems, how difficult have these problems made it for you to do your job along with other people? Somewhat difficult   Feeling nervous, anxious or on edge 2   Trouble relaxing 2   Being so restless that it is hard to sit still 1   GAD7 Patient Total 11   Not being able to stop or control worrying 2  Assessment: Kristina Snyder is a 63 year old female with history of depression, hypertension, hyperlipidemia who presents to establish care and for medication refills    Plan:    Depression  - ARIPiprazole (ABILIFY) 5 MG tablet; Take 0.5 tablets by mouth daily.  - PARoxetine (PAXIL) 40 MG tablet; Take 1 tablet by mouth daily.  - [place] PHQ9 FLOWSHEET    Anxiety  - ARIPiprazole (ABILIFY) 5 MG tablet; Take 0.5 tablets by mouth daily.  - PARoxetine (PAXIL) 40 MG tablet; Take 1 tablet by mouth daily.  - [place] PHQ9 FLOWSHEET    Hypertension  - losartan (COZAAR) 100 MG tablet; Take 1 tablet by mouth daily.    Hyperlipidemia  - atorvastatin (LIPITOR) 10 MG tablet; Take 1 tablet by mouth daily.    Dysuria  - Urinalysis  - Urine Culture Sterile Container      RTC 2 weeks for health maintenance exam, will perform fasting labs and thyroid testing at that time      Barriers to Learning assessed: none. Patient verbalizes understanding of teaching and instructions.        Medication Management:   Medications reviewed with patient and medication list reconciled.   Over the counter medications, herbal therapies and supplements reviewed.   Patient's understanding and response to medications assessed.   Barriers to medications assessed and addressed.   Risks, benefits, alternatives to medications reviewed      Kristina Shidler N. Shakaya Bhullar, MD PGY3    Patient seen and discussed with Dr. Pearline CablesIkeda

## 2013-11-04 ENCOUNTER — Ambulatory Visit: Payer: Commercial Managed Care - PPO | Admitting: Internal Medicine

## 2013-11-05 LAB — URINE CULTURE

## 2013-11-10 ENCOUNTER — Ambulatory Visit: Payer: Commercial Managed Care - PPO | Admitting: Internal Medicine

## 2013-11-15 ENCOUNTER — Encounter (INDEPENDENT_AMBULATORY_CARE_PROVIDER_SITE_OTHER): Payer: Self-pay | Admitting: Family Medicine

## 2013-12-31 ENCOUNTER — Other Ambulatory Visit: Payer: Self-pay | Admitting: Internal Medicine

## 2014-01-13 ENCOUNTER — Other Ambulatory Visit (INDEPENDENT_AMBULATORY_CARE_PROVIDER_SITE_OTHER): Payer: Self-pay | Admitting: Family Medicine

## 2014-01-13 DIAGNOSIS — F419 Anxiety disorder, unspecified: Principal | ICD-10-CM

## 2014-01-13 DIAGNOSIS — F32A Depression, unspecified: Secondary | ICD-10-CM

## 2014-01-13 DIAGNOSIS — F329 Major depressive disorder, single episode, unspecified: Secondary | ICD-10-CM

## 2014-01-13 NOTE — Telephone Encounter (Signed)
Received faxed refill request from PATIENT for PAXIL 40 mg   Last Office visit: 11/03/13   Upcoming visit: 01/27/14  Rx Last Filled: 11/03/13    Routing message to provider for approval/denial

## 2014-01-13 NOTE — Telephone Encounter (Signed)
Pt is requesting a refill for PAROXETINE HCL 40 MG OR TABS (Order# [id number]) to go to [facility] @ [address].  Pt states she is out of this and requested for this twice before. Pt states she saw Dr. Lorina RabonHeidari recently.   Pt is requesting a call back when rx has been sent to the pharmacy.

## 2014-01-14 MED ORDER — PAROXETINE HCL 40 MG OR TABS
40.0000 mg | ORAL_TABLET | Freq: Every day | ORAL | Status: DC
Start: 2014-01-14 — End: 2014-01-27

## 2014-01-14 NOTE — Telephone Encounter (Signed)
Refilled x 1 month. Per chart review, no previous electronic requests received by us. Can refill again once she's been seen but overdue for CPE, fasting labs, depression visit.

## 2014-01-27 ENCOUNTER — Other Ambulatory Visit: Payer: PPO | Attending: Family Practice | Admitting: Family Medicine

## 2014-01-27 ENCOUNTER — Encounter (INDEPENDENT_AMBULATORY_CARE_PROVIDER_SITE_OTHER): Payer: Self-pay | Admitting: Family Medicine

## 2014-01-27 VITALS — BP 135/80 | HR 97 | Temp 97.4°F | Ht 63.0 in | Wt 227.0 lb

## 2014-01-27 DIAGNOSIS — F3289 Other specified depressive episodes: Secondary | ICD-10-CM | POA: Insufficient documentation

## 2014-01-27 DIAGNOSIS — F329 Major depressive disorder, single episode, unspecified: Secondary | ICD-10-CM

## 2014-01-27 DIAGNOSIS — I10 Essential (primary) hypertension: Secondary | ICD-10-CM | POA: Insufficient documentation

## 2014-01-27 DIAGNOSIS — E785 Hyperlipidemia, unspecified: Secondary | ICD-10-CM | POA: Insufficient documentation

## 2014-01-27 DIAGNOSIS — Z0000 Encounter for general adult medical examination without abnormal findings: Principal | ICD-10-CM | POA: Insufficient documentation

## 2014-01-27 DIAGNOSIS — F419 Anxiety disorder, unspecified: Secondary | ICD-10-CM

## 2014-01-27 DIAGNOSIS — R300 Dysuria: Secondary | ICD-10-CM | POA: Insufficient documentation

## 2014-01-27 DIAGNOSIS — F411 Generalized anxiety disorder: Secondary | ICD-10-CM | POA: Insufficient documentation

## 2014-01-27 DIAGNOSIS — Z23 Encounter for immunization: Secondary | ICD-10-CM | POA: Insufficient documentation

## 2014-01-27 DIAGNOSIS — F32A Depression, unspecified: Secondary | ICD-10-CM

## 2014-01-27 LAB — URINALYSIS
Bilirubin: NEGATIVE
Glucose: NEGATIVE
Ketones: NEGATIVE
Leuk Esterase: NEGATIVE
Nitrite: NEGATIVE
Protein: NEGATIVE
Specific Gravity: 1.026 (ref 1.002–1.030)
Squam. Epithelial Cell: <1 (ref 0–?)
pH: 5 (ref 5.0–8.0)

## 2014-01-27 LAB — LIPID(CHOL FRACT) PANEL, BLOOD
Cholesterol: 154 mg/dL (ref <200)
HDL-Cholesterol: 54 mg/dL
LDL-Chol (Calc): 67 mg/dL (ref <160)
Non-HDL Cholesterol: 100 mg/dL
Triglycerides: 163 mg/dL (ref 10–170)

## 2014-01-27 LAB — CBC WITH DIFF, BLOOD
ANC-Automated: 3.8 10*3/uL (ref 1.6–7.0)
Abs Eosinophils: 0.3 10*3/uL (ref 0.1–0.5)
Abs Lymphs: 1.9 10*3/uL (ref 0.8–3.1)
Abs Monos: 0.5 10*3/uL (ref 0.2–0.8)
Eosinophils: 5 % — ABNORMAL HIGH (ref 1–4)
Hct: 42.6 % (ref 34.0–45.0)
Hgb: 14 gm/dL (ref 11.2–15.7)
Lymphocytes: 29 % (ref 19–53)
MCH: 30.1 pg (ref 26.0–32.0)
MCHC: 32.9 % (ref 32.0–36.0)
MCV: 91.6 um3 (ref 79.0–95.0)
MPV: 11.6 fL (ref 9.4–12.4)
Monocytes: 8 % (ref 5–12)
Plt Count: 275 10*3/uL (ref 140–370)
RBC: 4.65 10*6/uL (ref 3.90–5.20)
RDW: 14.1 % — ABNORMAL HIGH (ref 12.0–14.0)
Segs: 58 % (ref 34–71)
WBC: 6.6 10*3/uL (ref 4.0–10.0)

## 2014-01-27 LAB — COMPREHENSIVE METABOLIC PANEL, BLOOD
ALT (SGPT): 8 U/L (ref 0–33)
AST (SGOT): 18 U/L (ref 0–32)
Albumin: 4.2 g/dL (ref 3.5–5.2)
Alkaline Phos: 130 U/L (ref 35–140)
Anion Gap: 13 mmol/L (ref 7–15)
BUN: 13 mg/dL (ref 6–20)
Bicarbonate: 26 mmol/L (ref 22–29)
Bilirubin, Tot: 0.65 mg/dL (ref <1.20)
Calcium: 9.5 mg/dL (ref 8.5–10.6)
Chloride: 104 mmol/L (ref 98–107)
Creatinine: 0.9 mg/dL (ref 0.51–0.95)
GFR: >60 mL/min
Glucose: 91 mg/dL (ref 70–115)
Potassium: 4.2 mmol/L (ref 3.5–5.1)
Sodium: 143 mmol/L (ref 136–145)
Total Protein: 7.5 g/dL (ref 6.0–8.0)

## 2014-01-27 LAB — TSH, BLOOD: TSH: 1.35 u[IU]/mL (ref 0.27–4.20)

## 2014-01-27 LAB — GLYCOSYLATED HGB(A1C), BLOOD: Glyco Hgb (A1C): 5.8 % (ref 4.8–5.9)

## 2014-01-27 MED ORDER — ATORVASTATIN CALCIUM 10 MG OR TABS
10.0000 mg | ORAL_TABLET | Freq: Every day | ORAL | Status: DC
Start: 2014-01-27 — End: 2014-05-10

## 2014-01-27 MED ORDER — PAROXETINE HCL 40 MG OR TABS
40.0000 mg | ORAL_TABLET | Freq: Every day | ORAL | Status: DC
Start: 2014-01-27 — End: 2014-08-13

## 2014-01-27 MED ORDER — ARIPIPRAZOLE 5 MG OR TABS
2.5000 mg | ORAL_TABLET | Freq: Every day | ORAL | Status: AC
Start: 2014-01-27 — End: ?

## 2014-01-27 MED ORDER — LOSARTAN POTASSIUM 100 MG OR TABS
100.0000 mg | ORAL_TABLET | Freq: Every day | ORAL | Status: DC
Start: 2014-01-27 — End: 2014-05-10

## 2014-01-27 NOTE — Interdisciplinary (Signed)
Pre-visit chart review and huddle completed with staff and physician.    Outstanding labs, imaging and consults reviewed and identified.    Health maintanence issues identified and addressed:    Health Maintenance   Topic Date Due    IMM_TD/TDAP=>63 YO  03/30/1962    INFLUENZA VACCINE  06/08/2014    BREAST CANCER SCREENING  10/08/2014    CERVICAL CANCER SCREENING  10/09/2015

## 2014-01-27 NOTE — Progress Notes (Signed)
HEALTH MAINTENANCE EXAM    SUBJECTIVE:  Kristina Snyder is a 63 year old female here presenting with:  Chief Complaint   Patient presents with    Physical     no pap     Recently established care here    Overall doing well    Has questions about med refills, would like several month supply of all meds    Some dysuria still, previous culture mixed flora    Mood is ok  Recently last year cousin killed aunt, missing, now cousin is in jail  Stress from that  Has a lot of family support, not currently interested in counseling, no SI HI    Diet  Has trouble eating regular meals s/p gastric bypass, lots of nausea with bigger meals  Live on cookies  Breakfast: cup of tea  Lunch: 2 cookies and cup of tea, or cheese, bread  Dinner: pasta  Snack: cookies    Exercise: none. Member of gym. Knows she feels better with exercise      Last mammogram, PAP and colonoscopy last year all normal (colo with polyps, told 5 years)      Patient Active Problem List   Diagnosis    Hypertension    Hyperlipidemia    Depression    Anxiety     Past Medical History   Diagnosis Date    Hypertension     Hyperlipidemia     Depression     Anxiety      Past Surgical History   Procedure Laterality Date    Pb lap gastric bypass/roux-en-y      Pb reduction of large breast      Pb cesarean delivery only       x2     History     Social History    Marital Status: Married     Spouse Name: N/A     Number of Children: 2    Years of Education: N/A     Social History Main Topics    Smoking status: Current Every Day Smoker -- 0.50 packs/day for 20 years     Types: Cigarettes    Smokeless tobacco: Not on file      Comment: smoked since age 36, gave up for 20 years while children were growing up    Alcohol Use: No    Drug Use: No    Sexual Activity: No     Other Topics Concern    Military Service No    Blood Transfusions No    Caffeine Concern Yes     coffee soda    Occupational Exposure No    Hobby Hazards No    Sleep Concern No    Stress  Concern Yes    Weight Concern No    Special Diet No    Back Care No    Exercise No    Bike Helmet Yes    Seat Belt Yes    Self-Exams Yes     Social History Narrative    Lives with husband and son     Family History   Problem Relation Age of Onset    Heart Disease Father      open heart surgery, 5-6 stents    Diabetes Paternal Grandfather     Hypertension Mother     Hypertension Maternal Grandmother          REVIEW OF SYSTEMS  Gen: no fevers, chills, weight loss, dizziness  Kidney no dysuria, hematuria, changes in urination  Lungs: no cough, SOB, hemoptysis  Stomach: no bloody stools, no abdominal pain, nausea, vomiting  Neurologic: no weakness, tingling, problems walking or fainting  Vascular: no leg pain with walking, painful hands or digits  Oral: no bleeding from gums or sores  Heart: no chest pain or irregular heart beat  Psychiatric: no behavior changes  MSK no joint pain or leg swelling  ENT: no nose bleeds  Skin: no rashes  Blood: no bleeding or bruising  Endo: no feeling hot or cold          Current Outpatient Prescriptions   Medication Sig    ARIPiprazole (ABILIFY) 5 MG tablet Take 0.5 tablets by mouth daily.    atorvastatin (LIPITOR) 10 MG tablet Take 1 tablet by mouth daily.    losartan (COZAAR) 100 MG tablet Take 1 tablet by mouth daily.    PARoxetine (PAXIL) 40 MG tablet Take 1 tablet by mouth daily.     No current facility-administered medications for this visit.     Allergies as of 01/27/2014    (No Known Allergies)     OBJECTIVE:  BP 135/80   Pulse 97   Temp(Src) 97.4 F (36.3 C) (Oral)   Ht 5\' 3"  (1.6 m)   Wt 102.967 kg (227 lb)   BMI 40.22 kg/m2    Physical Exam:   General Appearance: obese elderly female, alert, no distress, pleasant affect, cooperative.  Head: NCAT, no frontal or maxillary sinus TTP  Eyes:  conjunctivae and corneas clear. PERRL, EOM's intact.   Ears:  normal TMs and canal.  Nose: nares patent  Mouth: normal, no erythema, palatal petechiae, or exudates  Neck:   Neck supple. No adenopathy, thyroid symmetric, normal size.  Heart:  normal rate and regular rhythm, no murmurs, clicks, or gallops.  Lungs: clear to auscultation bilaterally, no wheezes rales or rhonchi  Breast exam: no focal masses or point tenderness, no skin changes aisde from well healed surgical scars, no nipple retraction.  Abdomen: BS normal.  Abdomen soft, non-tender.  No umbilicus, several well healed surgical scars  Pelvic exam:  deferred  Extremities:  no cyanosis, clubbing, or edema.  Skin:  Skin color, texture, turgor normal. No rashes or lesions.  Neuro: CN II-XII grossly intact. 5/5 strength throughout. Sensation to light touch intact. Gait normal.     Assessment/Plan:  Merleen NicelyRoxana Filley is a 63 year old female with history of depression/anxiety, hypertension, hyperlipidemia who presents for well woman exam.       Well woman exam (no gynecological exam)    Laboratory exam ordered as part of routine general medical examination  - Comprehensive Metabolic Panel Green Plasma Separator Tube; Future  - CBC w/Diff Lavender; Future  - Lipid Panel Green Plasma Separator Tube; Future  - Glycosylated Hgb(A1C), Blood Lavender; Future  - TSH, Blood Green Plasma Separator Tube; Future  - Vitamin D, 25-Hydroxy, Blood Yellow serum separator tube; Future    Dysuria  - Urinalysis  - Urine Culture Sterile Container    Depression  - ARIPiprazole (ABILIFY) 5 MG tablet; Take 0.5 tablets by mouth daily.  - PARoxetine (PAXIL) 40 MG tablet; Take 1 tablet by mouth daily.    Anxiety  - ARIPiprazole (ABILIFY) 5 MG tablet; Take 0.5 tablets by mouth daily.  - PARoxetine (PAXIL) 40 MG tablet; Take 1 tablet by mouth daily.    Hyperlipidemia  - atorvastatin (LIPITOR) 10 MG tablet; Take 1 tablet by mouth daily.    Essential hypertension  - losartan (COZAAR) 100 MG tablet;  Take 1 tablet by mouth daily.    Need for prophylactic vaccination against diphtheria-tetanus-pertussis (DTP)  - Tdap (BOOSTRIX/ADACEL) for patients 11-64 years  old          Barriers to Learning assessed: none. Patient verbalizes understanding of teaching and instructions.     Imagene ShellerShireen Shae Augello, MD  [facility] PGY-3  Pt discussed and seen with attending Dr. Wendall PapaHatamy

## 2014-01-29 ENCOUNTER — Telehealth (INDEPENDENT_AMBULATORY_CARE_PROVIDER_SITE_OTHER): Payer: Self-pay | Admitting: Family Medicine

## 2014-01-29 DIAGNOSIS — E559 Vitamin D deficiency, unspecified: Principal | ICD-10-CM

## 2014-01-29 DIAGNOSIS — R300 Dysuria: Secondary | ICD-10-CM

## 2014-01-29 LAB — VITAMIN D, 25-OH TOTAL
Vitamin D, 25-OH D2: <5 ng/mL
Vitamin D, 25-OH D3: 15 ng/mL
Vitamin D, 25-OH TOTAL: 15 ng/mL — ABNORMAL LOW (ref 30–80)

## 2014-01-29 LAB — URINE CULTURE

## 2014-01-29 MED ORDER — ERGOCALCIFEROL 50000 UNIT OR CAPS
1.0000 | ORAL_CAPSULE | ORAL | Status: DC
Start: 2014-02-01 — End: 2014-05-25

## 2014-01-29 NOTE — Telephone Encounter (Signed)
Please call patient let her know that I have reviewed her results, labs overall looked good but her vitamin D is low, needs to start 1 pill weekly for next 8 weeks, then we can recheck    Otherwise urine again showed some small blood and mixed bacteria but no one bacteria causing infection. Because there is a small amount of blood in the urine, there may be an infection but we should investigate this further. Please have her come back to drop off another urine sample at her convenience - will need very clean catch       Will FYI Dr. Wendall PapaHatamy who saw patient with me in case he would opt for treatment and test of cure UA/Ucx. Appreciate coordination of care

## 2014-01-29 NOTE — Progress Notes (Signed)
Attending Note:    Subjective:  I reviewed the history.  Patient interviewed and examined.  History of present illness (HPI):  cpe     Review of Systems (ROS): As per  the resident's note.  Past Medical, Family, Social History:  As per  the resident's  note.    Objective:   I have examined the patient and I concur with the resident's exam.    Assessment and plan reviewed with the resident physician.  I agree with the resident's plan as documented.

## 2014-02-01 NOTE — Telephone Encounter (Signed)
Called patient and let her know the results. I told the patient she needs to come in and submit another Clean urine sample. Patient agreed and understood.

## 2014-02-03 ENCOUNTER — Other Ambulatory Visit: Payer: PPO | Attending: Family Practice | Admitting: Family Medicine

## 2014-02-03 ENCOUNTER — Encounter (INDEPENDENT_AMBULATORY_CARE_PROVIDER_SITE_OTHER): Payer: Self-pay | Admitting: Family Medicine

## 2014-02-03 VITALS — BP 135/77 | HR 65 | Temp 97.7°F | Wt 228.0 lb

## 2014-02-03 DIAGNOSIS — R300 Dysuria: Principal | ICD-10-CM | POA: Insufficient documentation

## 2014-02-03 DIAGNOSIS — R059 Cough, unspecified: Secondary | ICD-10-CM | POA: Insufficient documentation

## 2014-02-03 DIAGNOSIS — Z2089 Contact with and (suspected) exposure to other communicable diseases: Secondary | ICD-10-CM

## 2014-02-03 DIAGNOSIS — R05 Cough: Secondary | ICD-10-CM

## 2014-02-03 LAB — URINALYSIS
Blood: NEGATIVE
Glucose: NEGATIVE
Ketones: NEGATIVE
Leuk Esterase: NEGATIVE
Nitrite: NEGATIVE
Specific Gravity: 1.033 — ABNORMAL HIGH (ref 1.002–1.030)
pH: 5 (ref 5.0–8.0)

## 2014-02-03 MED ORDER — AZITHROMYCIN 250 MG OR TABS
ORAL_TABLET | ORAL | Status: DC
Start: 2014-02-03 — End: 2014-02-18

## 2014-02-03 NOTE — Patient Instructions (Addendum)
My name is Ivin PootCatherine Bosshart and I was the Medical Assistant that was caring for you today.  If you have any questions or concerns regarding today's visit, please feel free to give me a call at [phone].  If you receive a survey in the mail, we would appreciate your feedback.    Thank you [facility].        Start antibiotics for cough  Start vitamin D for low vitamin D  Follow up in 6-8 weeks for recheck vitamin D  Return if cough not improving 1 week

## 2014-02-03 NOTE — Interdisciplinary (Signed)
Pre-visit chart review and huddle completed with staff and physician.    Outstanding labs, imaging and consults reviewed and identified.    Health maintanence issues identified and addressed:    Health Maintenance   Topic Date Due   . INFLUENZA VACCINE  06/08/2014   . BREAST CANCER SCREENING  10/08/2014   . CERVICAL CANCER SCREENING  10/09/2015   . IMM_TD/TDAP=>63 YO  01/28/2024

## 2014-02-03 NOTE — Progress Notes (Signed)
Family Medicine Clinic Progress Note    CC: cough, sore throat, exposure to pneumonia    Subjective: Kristina Snyder is a 63 year old female who presents for evaluation of:    24 hours of sore throat, face and teeth pain, headache  Cough, nasal congestion  No fever    Son has immune deficiency and autism, she's scared to be sick around him. He was recently diagnosed with pneumonia at ER on Saturday, just finished Z pack, doesn't want to make him sick again    Otherwise still has intermittent dysuria, cultures have been mixed flora x2, was going to submit repeat before she made this appointment. No nausea or vomiting, no back pain        Patient Active Problem List   Diagnosis    Hypertension    Hyperlipidemia    Depression    Anxiety     Outpatient Prescriptions Prior to Visit   Medication Sig Dispense Refill    ARIPiprazole (ABILIFY) 5 MG tablet Take 0.5 tablets by mouth daily.  30 tablet  5    atorvastatin (LIPITOR) 10 MG tablet Take 1 tablet by mouth daily.  30 tablet  2    ergocalciferol (ERGOCALCIFEROL) 50000 UNIT capsule Take 1 capsule by mouth once a week.  8 capsule  0    losartan (COZAAR) 100 MG tablet Take 1 tablet by mouth daily.  30 tablet  2    PARoxetine (PAXIL) 40 MG tablet Take 1 tablet by mouth daily.  30 tablet  5     No facility-administered medications prior to visit.     Immunization History   Administered Date(s) Administered    Tdap 01/27/2014     No Known Allergies    Objective:  Filed Vitals:    02/03/14 0948 02/03/14 1010   BP: 135/77    Pulse: 65    Temp: 97.7 F (36.5 C)    TempSrc: Oral    Weight: 103.42 kg (228 lb)    SpO2:  97%       Estimated body mass index is 40.4 kg/(m^2) as calculated from the following:    Height as of 01/27/14: 5\' 3"  (1.6 m).    Weight as of this encounter: 103.42 kg (228 lb).    Physical Examination  General Appearance: healthy, alert, no distress, pleasant affect, cooperative.  Neck: mild lymphadenopathy R  Mouth: mild pharyngeal erythema no  exudate  Face: no sinus tenderness  Heart:  normal rate and regular rhythm, no murmurs, clicks, or gallops.  Lungs: clear to auscultation but decreased at bases  Abdomen: Abdomen soft, non-tender. Bowel sounds normal.    Labs: culture urine MUF x 2, trace blood    Assessment: Kristina NicelyRoxana Snyder is a 63 year old female who presents for evaluation of cough. Given her decreased O2 saturation and exposure, will treat with antibiotic. Will also have her repeat urine sample at this time.     Plan:    Cough  - azithromycin (ZITHROMAX) 250 MG tablet; Take 2 tablets by mouth on day 1, then take 1 tablet daily on days 2-5.    Exposure to pneumonia  - azithromycin (ZITHROMAX) 250 MG tablet; Take 2 tablets by mouth on day 1, then take 1 tablet daily on days 2-5.    Dysuria  - Urinalysis  - Urine Culture Sterile Container      Follow up 1 week if no improvement        Barriers to Learning assessed: none. Patient verbalizes understanding  of teaching and instructions.     Medication Management:   Medications reviewed with patient and medication list reconciled.   Over the counter medications, herbal therapies and supplements reviewed.   Patient's understanding and response to medications assessed.   Barriers to medications assessed and addressed.   Risks, benefits, alternatives to medications reviewed      Kania Regnier N. Demonica Farrey, MD PGY3    Patient seen and discussed with Dr. Aundria RudWilkinson

## 2014-02-05 LAB — URINE CULTURE: Urine Culture Result: NO GROWTH

## 2014-02-17 NOTE — Progress Notes (Signed)
Attending Note:    Subjective:  I reviewed the history.  Patient interviewed and examined.  History of present illness (HPI):  Pharyngitis     Review of Systems (ROS): As per  the resident's note.  Past Medical, Family, Social History:  As per  the resident's  note.    Objective:   I have examined the patient and I concur with the resident's exam.  The test results were reviewed with the resident.    Assessment and plan reviewed with the resident physician.  I agree with the resident's plan as documented.    See the resident's note for further details.

## 2014-02-18 ENCOUNTER — Ambulatory Visit
Admission: RE | Admit: 2014-02-18 | Discharge: 2014-02-18 | Disposition: A | Discharge status: Home / Self Care | Payer: PPO | Source: Ambulatory Visit | Attending: Neuroradiology | Admitting: Neuroradiology

## 2014-02-18 ENCOUNTER — Ambulatory Visit (INDEPENDENT_AMBULATORY_CARE_PROVIDER_SITE_OTHER): Payer: PPO | Admitting: Family Medicine

## 2014-02-18 ENCOUNTER — Telehealth (INDEPENDENT_AMBULATORY_CARE_PROVIDER_SITE_OTHER): Payer: Self-pay | Admitting: Family Medicine

## 2014-02-18 ENCOUNTER — Encounter (INDEPENDENT_AMBULATORY_CARE_PROVIDER_SITE_OTHER): Payer: Self-pay | Admitting: Family Medicine

## 2014-02-18 VITALS — BP 128/87 | HR 69 | Temp 97.2°F | Wt 231.0 lb

## 2014-02-18 DIAGNOSIS — R399 Unspecified symptoms and signs involving the genitourinary system: Secondary | ICD-10-CM

## 2014-02-18 DIAGNOSIS — R059 Cough, unspecified: Secondary | ICD-10-CM

## 2014-02-18 DIAGNOSIS — R053 Chronic cough: Secondary | ICD-10-CM

## 2014-02-18 DIAGNOSIS — R05 Cough: Principal | ICD-10-CM

## 2014-02-18 DIAGNOSIS — F17200 Nicotine dependence, unspecified, uncomplicated: Secondary | ICD-10-CM

## 2014-02-18 MED ORDER — BENZONATATE 100 MG OR CAPS
100.0000 mg | ORAL_CAPSULE | Freq: Three times a day (TID) | ORAL | Status: DC | PRN
Start: 2014-02-18 — End: 2014-05-25

## 2014-02-18 NOTE — Progress Notes (Signed)
[facility] Progress Note    CC: follow up cough    Subjective: Kristina NicelyRoxana Snyder is a 63 year old female smoker who presents for     Cough x 2 weeks  Son was diagnosed with PNA  Already completed azithro  Throughout the day, mostly at night  Smoking 6-7 cigs a day    Not taking anything    No fevers, chills      Otherwise continues to have suprapubic pressure, dysuria, negative cultures/MUF x 3  Would like referral to urology        Patient Active Problem List   Diagnosis    Hypertension    Hyperlipidemia    Depression    Anxiety     Outpatient Prescriptions Prior to Visit   Medication Sig Dispense Refill    ARIPiprazole (ABILIFY) 5 MG tablet Take 0.5 tablets by mouth daily. 30 tablet 5    atorvastatin (LIPITOR) 10 MG tablet Take 1 tablet by mouth daily. 30 tablet 2    ergocalciferol (ERGOCALCIFEROL) 50000 UNIT capsule Take 1 capsule by mouth once a week. 8 capsule 0    losartan (COZAAR) 100 MG tablet Take 1 tablet by mouth daily. 30 tablet 2    PARoxetine (PAXIL) 40 MG tablet Take 1 tablet by mouth daily. 30 tablet 5     No facility-administered medications prior to visit.     Immunization History   Administered Date(s) Administered    Tdap 01/27/2014     No Known Allergies    Objective:  Filed Vitals:    02/18/14 1000   BP: 128/87   Pulse: 69   Temp: 97.2 F (36.2 C)   TempSrc: Oral   Weight: 104.781 kg (231 lb)   SpO2: 98%   PF: 325 L/min     Expected 382 (82% of predicted)      Estimated body mass index is 40.93 kg/(m^2) as calculated from the following:    Height as of 01/27/14: 5\' 3"  (1.6 m).    Weight as of this encounter: 104.781 kg (231 lb).    Physical Examination  General Appearance: Obese female, alert, no distress, pleasant affect, cooperative.  Eyes:  conjunctivae and corneas clear. PERRL, EOM's intact.   Ears:  normal TMs and canal.  Nose:  normal.  Mouth: normal.  Neck:  Neck supple. No adenopathy  Heart:  normal rate and regular rhythm, no murmurs, clicks, or gallops.  Lungs:  clear to auscultation        Assessment: Kristina NicelyRoxana Snyder is a 63 year old female who presents for evaluation of continued cough. Given duration of her symptoms and status post antibiotic therapy, will obtain chest x-ray. Counseled on decreasing tobacco intake given that is likely exacerbating her symptoms    Plan:    Persistent cough  - benzonatate (TESSALON) 100 MG capsule; Take 1 capsule by mouth 3 times daily as needed for Cough.  - X-Ray Chest Frontal And Lateral    Urinary tract infection symptoms  - Cytopath Non Gyn; Standing  - Urology    Smoker  - Urology      RTC 4 weeks, sooner if concerns      Barriers to Learning assessed: none. Patient verbalizes understanding of teaching and instructions.     Medication Management:   Medications reviewed with patient and medication list reconciled.   Over the counter medications, herbal therapies and supplements reviewed.   Patient's understanding and response to medications assessed.   Barriers to medications assessed and addressed.  Risks, benefits, alternatives to medications reviewed      Carrie Schoonmaker N. Landin Tallon, MD PGY3    Patient seen and discussed with Dr. Jennye BoroughsBazzo

## 2014-02-18 NOTE — Interdisciplinary (Signed)
Pre-visit chart review and huddle completed with staff and physician.    Outstanding labs, imaging and consults reviewed and identified.    Health maintanence issues identified and addressed:    Health Maintenance   Topic Date Due    INFLUENZA VACCINE  06/08/2014    BREAST CANCER SCREENING  10/08/2014    CERVICAL CANCER SCREENING  10/09/2015    IMM_TD/TDAP=>63 YO  01/28/2024

## 2014-02-18 NOTE — Telephone Encounter (Signed)
Chest XRay without infection  Please let patient know      IMPRESSION:  Normal cardiomediastinal silhouette. Clear lungs and pleural spaces. Intact   thoracic skeleton. Negative study.

## 2014-02-18 NOTE — Patient Instructions (Addendum)
My name is Angelina OkLindsay Benson and I was the Medical Assistant that was caring for you today. If you have any questions or concerns regarding today's visit, please feel free to give me a call at [phone]. If you receive a survey in the mail, we would appreciate your feedback. Thank you for choosing [facility], [facility] for all your healthcare needs.          Chest XRay for cough  Tessalon for cough  Urine cytology to check for abnormal cells  Urology referral  Consider pelvic exam for cause of discomfort

## 2014-02-19 NOTE — Telephone Encounter (Signed)
Pt.notified

## 2014-03-13 NOTE — Progress Notes (Signed)
Attending Note:    Subjective:  I reviewed the history.  Patient interviewed and examined.  History of present illness (HPI):  Cough     Review of Systems (ROS): As per  the resident's note.  Past Medical, Family, Social History:  As per  the resident's  note.    Objective:   I have examined the patient and I concur with the resident's exam.    Assessment and plan reviewed with the resident physician.  I agree with the resident's plan as documented.    See the resident's note for further details.

## 2014-04-28 ENCOUNTER — Encounter (INDEPENDENT_AMBULATORY_CARE_PROVIDER_SITE_OTHER): Payer: Self-pay | Admitting: Family Practice

## 2014-04-28 ENCOUNTER — Telehealth (INDEPENDENT_AMBULATORY_CARE_PROVIDER_SITE_OTHER): Payer: Self-pay | Admitting: Family Medicine

## 2014-04-28 ENCOUNTER — Other Ambulatory Visit: Payer: PPO | Attending: Family Practice | Admitting: Family Practice

## 2014-04-28 VITALS — BP 132/86 | HR 74 | Temp 97.1°F | Wt 223.5 lb

## 2014-04-28 DIAGNOSIS — R300 Dysuria: Principal | ICD-10-CM | POA: Insufficient documentation

## 2014-04-28 LAB — UA, CHEM ONLY POCT (BAYER)
Bilirubin: NEGATIVE
Glucose: NEGATIVE
Ketones: NEGATIVE
Nitrite: NEGATIVE
Protein: NEGATIVE
Specific Gravity: 1.015 (ref 1.002–1.030)
Urobilinogen: 1 EU/dl (ref 0.2–1.0)
pH: 5.5 (ref 5–8)

## 2014-04-28 MED ORDER — NITROFURANTOIN MONOHYD MACRO 100 MG OR CAPS
100.0000 mg | ORAL_CAPSULE | Freq: Two times a day (BID) | ORAL | Status: DC
Start: 2014-04-28 — End: 2014-05-25

## 2014-04-28 NOTE — Telephone Encounter (Signed)
Chief complaint: Pt having symptoms of uti, burning/pain and freq urinating.   Onset: 5 days  Duration: ongoing  Treatments: none  Fever: none  Chest Pain: none   Shortness of Breath: none   Additional info: All acute appts on hold for same day.

## 2014-04-28 NOTE — Telephone Encounter (Signed)
Appt sched today

## 2014-04-28 NOTE — Patient Instructions (Addendum)
My name is Sheila Olmos and I was the Medical Assistant that was caring for you today. If you have any questions or concerns regarding today's visit, please feel free to give me a call at [phone].  If you receive a survey in the mail, we would appreciate your feedback.  Thank you for choosing [facility] for all you healthcare needs.  --------------------------  Push fluids, take antibiotics as directed. Patient may use OTC Pyridium as desired.

## 2014-04-28 NOTE — Interdisciplinary (Signed)
Pre-visit chart review and huddle completed with staff and physician.    Outstanding labs, imaging and consults reviewed and identified.    Health maintanence issues identified and addressed:    Health Maintenance   Topic Date Due    COLON CANCER SCREENING WITH COLONOSCOPY  03/29/2002    INFLUENZA VACCINE  06/08/2014    BREAST CANCER SCREENING  10/08/2014    CERVICAL CANCER SCREENING  10/09/2015    IMM_TD/TDAP=>63 YO  01/28/2024

## 2014-04-30 ENCOUNTER — Telehealth (INDEPENDENT_AMBULATORY_CARE_PROVIDER_SITE_OTHER): Payer: Self-pay | Admitting: Family Practice

## 2014-04-30 DIAGNOSIS — N390 Urinary tract infection, site not specified: Principal | ICD-10-CM

## 2014-04-30 LAB — URINE CULTURE

## 2014-04-30 MED ORDER — CEPHALEXIN 500 MG OR CAPS
500.0000 mg | ORAL_CAPSULE | Freq: Four times a day (QID) | ORAL | Status: DC
Start: 2014-04-30 — End: 2014-05-25

## 2014-04-30 NOTE — Telephone Encounter (Signed)
Talked to patient about her ucx results and the need to change the abx, The patient indicates understanding of these issues and agrees to the plan.

## 2014-04-30 NOTE — Progress Notes (Signed)
SUBJECTIVE: Kristina Snyder is a 63 year old female who complains of urinary frequency, urgency and dysuria x 3 days, without flank pain, fever, chills, or abnormal vaginal discharge or bleeding.     OBJECTIVE:   Blood pressure 132/86, pulse 74, temperature 97.1 F (36.2 C), temperature source Oral, weight 101.379 kg (223 lb 8 oz), SpO2 98 %.    Appears well, in no apparent distress.  Vital signs are normal. The abdomen is soft without tenderness, guarding, mass, rebound or organomegaly. No CVA tenderness.     Urine dipstick shows: LE and blood    ASSESSMENT: UTI uncomplicated without evidence of pyelonephritis    PLAN: Treatment per orders - also push fluids, may use Pyridium OTC prn. Call or return to clinic prn if these symptoms worsen or fail to improve as anticipated. May change abx pending cx results.    The patient indicates understanding of these issues and agrees to the plan.

## 2014-05-10 ENCOUNTER — Other Ambulatory Visit (INDEPENDENT_AMBULATORY_CARE_PROVIDER_SITE_OTHER): Payer: Self-pay | Admitting: Family Practice

## 2014-05-10 DIAGNOSIS — I10 Essential (primary) hypertension: Secondary | ICD-10-CM

## 2014-05-10 DIAGNOSIS — E785 Hyperlipidemia, unspecified: Principal | ICD-10-CM

## 2014-05-10 MED ORDER — LOSARTAN POTASSIUM 100 MG OR TABS
ORAL_TABLET | ORAL | Status: DC
Start: 2014-05-10 — End: 2014-08-20

## 2014-05-10 MED ORDER — ATORVASTATIN CALCIUM 10 MG OR TABS
ORAL_TABLET | ORAL | Status: DC
Start: 2014-05-10 — End: 2014-08-25

## 2014-05-10 NOTE — Telephone Encounter (Signed)
atorvastatin (LIPITOR) 10 MG tablet [[id number]][[id number]]  Order Details    Dose: 10 mg Route: Oral Frequency: DAILY   Dispense Quantity:  30 tablet Refills:  2 Fills Remaining:  2          Sig: Take 1 tablet by mouth daily.         Written Date:  01/27/14 Expiration Date:  --     Start Date:  01/27/14 End Date:  --          losartan (COZAAR) 100 MG tablet [[id number]][[id number]]  Order Details    Dose: 100 mg Route: Oral Frequency: DAILY   Dispense Quantity:  30 tablet Refills:  2 Fills Remaining:  2          Sig: Take 1 tablet by mouth daily.         Written Date:  01/27/14 Expiration Date:  --     Start Date:  01/27/14 End Date:  --

## 2014-05-10 NOTE — Telephone Encounter (Signed)
I have never seen or evaluated pt ever. Pt resident PCP, Dr. Lorina RabonHeidari no longer at this practice.  Refill. Labs reviewed. BP's noted.    Refill statin and arb.    Results for Kristina Snyder, Kristina Snyder (MRN [id number]) as of 05/10/2014 16:50   Ref. Range 01/27/2014 10:00   Sodium Latest Range: 136-145 mmol/L 143   Potassium Latest Range: 3.5-5.1 mmol/L 4.2   Chloride Latest Range: 98-107 mmol/L 104   Bicarbonate Latest Range: 22-29 mmol/L 26   Anion Gap Latest Range: 7-15 mmol/L 13   BUN Latest Range: 6-20 mg/dL 13   Creatinine Latest Range: 0.51-0.95 mg/dL 7.820.90   GFR No range found >60   Glucose Latest Range: 70-115 mg/dL 91   Calcium Latest Range: 8.5-10.6 mg/dL 9.5   Alkaline Phos Latest Range: 35-140 U/L 130   ALT (SGPT) Latest Range: 0-33 U/L 8   AST (SGOT) Latest Range: 0-32 U/L 18   Bilirubin, Tot Latest Range: <1.20 mg/dL 9.560.65   ALBUMIN Latest Range: 3.5-5.2 g/dL 4.2   Total Protein Latest Range: 6.0-8.0 g/dL 7.5   Glyco Hgb (O1HA1C) Latest Range: 4.8-5.9 % 5.8   Triglycerides Latest Range: 10-170 mg/dL 086163   Cholesterol Latest Range: <200 mg/dL 578154   HDL-Cholesterol No range found 54   Non-HDL Cholesterol No range found 100   LDL-Chol (Calc) Latest Range: <160 mg/dL 67   TSH Latest Range: 0.27-4.20 uIU/mL 1.35   Vitamin D, 25-OH D2 No range found <5   Vitamin D, 25-OH D3 No range found 15   Vitamin D, 25-OH TOTAL Latest Range: 30-80 ng/mL 15 (L)   WBC Latest Range: 4.0-10.0 1000/mm3 6.6   RBC Latest Range: 3.90-5.20 mill/mm3 4.65   Hgb Latest Range: 11.2-15.7 gm/dL 46.914.0   Hct Latest Range: 34.0-45.0 % 42.6   MCV Latest Range: 79.0-95.0 um3 91.6   MCH Latest Range: 26.0-32.0 pgm 30.1   MCHC Latest Range: 32.0-36.0 % 32.9   RDW Latest Range: 12.0-14.0 % 14.1 (H)   Plt Count Latest Range: [phone]/mm3 275   MPV Latest Range: 9.4-12.4 fL 11.6   Segs Latest Range: 34-71 % 58   Lymphocytes Latest Range: 19-53 % 29   Monocytes Latest Range: 5-12 % 8   Eosinophils Latest Range: <1-4 % 5 (H)   ANC-Automated Latest Range: 1.6-7.0  1000/mm3 3.8   Abs Lymphs Latest Range: 0.8-3.1 1000/mm3 1.9   Abs Monos Latest Range: 0.2-0.8 1000/mm3 0.5   Abs Eosinophils Latest Range: <0.1-0.5 1000/mm3 0.3   Diff Type No range found Automated

## 2014-05-21 ENCOUNTER — Encounter (INDEPENDENT_AMBULATORY_CARE_PROVIDER_SITE_OTHER): Payer: Self-pay | Admitting: Internal Medicine

## 2014-05-21 DIAGNOSIS — Z0000 Encounter for general adult medical examination without abnormal findings: Principal | ICD-10-CM

## 2014-05-25 ENCOUNTER — Encounter (INDEPENDENT_AMBULATORY_CARE_PROVIDER_SITE_OTHER): Payer: Self-pay | Admitting: Family Practice

## 2014-05-25 ENCOUNTER — Other Ambulatory Visit: Payer: PPO | Attending: Family Practice | Admitting: Family Practice

## 2014-05-25 VITALS — BP 140/79 | HR 76 | Temp 97.0°F | Wt 223.0 lb

## 2014-05-25 DIAGNOSIS — R300 Dysuria: Secondary | ICD-10-CM | POA: Insufficient documentation

## 2014-05-25 DIAGNOSIS — R3129 Other microscopic hematuria: Secondary | ICD-10-CM | POA: Insufficient documentation

## 2014-05-25 DIAGNOSIS — N390 Urinary tract infection, site not specified: Principal | ICD-10-CM | POA: Insufficient documentation

## 2014-05-25 DIAGNOSIS — R109 Unspecified abdominal pain: Secondary | ICD-10-CM | POA: Insufficient documentation

## 2014-05-25 LAB — UA, CHEM ONLY POCT (BAYER)
Bilirubin: NEGATIVE
Glucose: NEGATIVE
Leuk Esterase: NEGATIVE
Nitrite: NEGATIVE
Protein: NEGATIVE
Specific Gravity: 1.02 (ref 1.002–1.030)
Urobilinogen: 0.2 EU/dl (ref 0.2–1.0)
pH: 5.5 (ref 5–8)

## 2014-05-25 LAB — BASIC METABOLIC PANEL, BLOOD
Anion Gap: 12 mmol/L (ref 7–15)
BUN: 17 mg/dL (ref 6–20)
Bicarbonate: 26 mmol/L (ref 22–29)
Calcium: 9.4 mg/dL (ref 8.5–10.6)
Chloride: 105 mmol/L (ref 98–107)
Creatinine: 0.93 mg/dL (ref 0.51–0.95)
GFR: >60 mL/min
Glucose: 73 mg/dL (ref 70–115)
Potassium: 3.7 mmol/L (ref 3.5–5.1)
Sodium: 143 mmol/L (ref 136–145)

## 2014-05-25 MED ORDER — CIPROFLOXACIN HCL 500 MG OR TABS
500.0000 mg | ORAL_TABLET | Freq: Two times a day (BID) | ORAL | Status: DC
Start: 2014-05-25 — End: 2014-07-01

## 2014-05-25 NOTE — Progress Notes (Signed)
Kristina NicelyRoxana Snyder is a 63 year old female who comes in with complaints of dysuria, urgency, frequency, after finishing antibiotics.  Seen 04/28/2014 with similar symptoms. Prescribed nitrofurantoin, and changed to cephalexin 500 mg qid x 7 days when culture grew klebsiella > 100K, intermediate sensitivity to nitrofurantoin.  After initial treatment, she developed left-sided flank pain, which has been present intermittently, and sensation of heaviness in her blader.  No nausea or vomiting, fever, chills, gross blood in urine.    She has previously been seen for these symptoms in the absence of infection, with occasional small amounts of blood seen in urine.  Referral made by Dr. Lorina RabonHeidari to urology, but patient never scheduled appointment.    Patient Active Problem List    Diagnosis Date Noted    Hypertension 11/03/2013    Hyperlipidemia 11/03/2013    Depression 11/03/2013    Anxiety 11/03/2013     Current Outpatient Prescriptions   Medication Sig    ARIPiprazole (ABILIFY) 5 MG tablet Take 0.5 tablets by mouth daily.    atorvastatin (LIPITOR) 10 MG tablet TAKE 1 TABLET BY MOUTH DAILY.         losartan (COZAAR) 100 MG tablet TAKE 1 TABLET BY MOUTH DAILY.    PARoxetine (PAXIL) 40 MG tablet Take 1 tablet by mouth daily.     OBJECTIVE:  BP 140/79 mmHg   Pulse 76   Temp(Src) 97 F (36.1 C) (Oral)   Wt 101.152 kg (223 lb)  Moist mucous membranes. Lungs are clear. CV regular rate and rhythm; no murmur or gallop. No CVAT. Abdomen is benign.    Results for orders placed or performed in visit on 05/25/14   UA, CHEM ONLY POCT (BAYER)   Result Value Ref Range    Color YELLOW     Appearance CLEAR     Glucose NEG Neg    Bilirubin NEG Neg    Ketones TRACE Neg    Specific Gravity 1.020 1.002 - 1.030    Blood 2+ Neg    pH 5.5 5 - 8    Protein NEG Neg    Urobilinogen 0.2 0.2 - 1.0 EU/dl    Nitrite NEG     Leuk Esterase NEG Neg       ASSESSMENT:  Dysuria; recent UTI should have been sensitive to antibiotic prescribed.  Flank  pain/hematuria, consider stone.  Smoker, increased risk for bladder neoplasm.    PLAN:  Urine culture ordered.  Rx given for cipro for 1 week.  CT urogram ordered.  F/U to be arranged pending results. Call/RTC otherwise prn.

## 2014-05-25 NOTE — Interdisciplinary (Signed)
Pre-visit chart review and huddle completed with staff and physician.    Outstanding labs, imaging and consults reviewed and identified.    Health maintanence issues identified and addressed:    Health Maintenance   Topic Date Due    COLON CANCER SCREENING WITH COLONOSCOPY  03/29/2002    INFLUENZA VACCINE  06/08/2014    BREAST CANCER SCREENING  10/08/2014    CERVICAL CANCER SCREENING  10/09/2015    IMM_TD/TDAP=>63 YO  01/28/2024

## 2014-05-25 NOTE — Addendum Note (Signed)
Addended byShelly Coss: GALE, MELODY on: 05/25/2014 04:25 PM     Modules accepted: Orders

## 2014-05-27 LAB — URINE CULTURE: Urine Culture Result: NO GROWTH

## 2014-06-22 ENCOUNTER — Ambulatory Visit
Admission: RE | Admit: 2014-06-22 | Discharge: 2014-06-22 | Disposition: A | Discharge status: Home / Self Care | Payer: PPO | Source: Ambulatory Visit | Attending: Body Imaging | Admitting: Body Imaging

## 2014-06-22 DIAGNOSIS — R319 Hematuria, unspecified: Secondary | ICD-10-CM | POA: Insufficient documentation

## 2014-06-22 DIAGNOSIS — R109 Unspecified abdominal pain: Principal | ICD-10-CM | POA: Insufficient documentation

## 2014-06-22 DIAGNOSIS — R3129 Other microscopic hematuria: Secondary | ICD-10-CM

## 2014-06-23 ENCOUNTER — Telehealth (INDEPENDENT_AMBULATORY_CARE_PROVIDER_SITE_OTHER): Payer: Self-pay | Admitting: Family Practice

## 2014-06-23 NOTE — Telephone Encounter (Signed)
Pt request results from 06/22/14 CT scan, pt advised that results are still pending, pt expressed understanding.  Pt is also requesting an appointment w/ Dr. Colen Darling, her soonest available is 06/29/14, pt request a sooner appointment w/ Dr. Colen Darling only.  Please advise.

## 2014-06-24 NOTE — Telephone Encounter (Signed)
Pt informed

## 2014-06-24 NOTE — Telephone Encounter (Signed)
Would you like to add her anywhere or is the 22nd reasonable?

## 2014-06-24 NOTE — Telephone Encounter (Signed)
Results are back, normal.  Letter was sent.  (9/22 is reasonable; I am only here 1/2 day tomorrow, and then 9/22 is my next day in clinic).

## 2014-07-01 ENCOUNTER — Encounter (INDEPENDENT_AMBULATORY_CARE_PROVIDER_SITE_OTHER): Payer: Self-pay | Admitting: Family Practice

## 2014-07-01 ENCOUNTER — Other Ambulatory Visit: Payer: PPO | Attending: Family Practice | Admitting: Family Practice

## 2014-07-01 VITALS — BP 118/71 | HR 98 | Temp 97.9°F | Wt 222.0 lb

## 2014-07-01 DIAGNOSIS — J209 Acute bronchitis, unspecified: Secondary | ICD-10-CM | POA: Insufficient documentation

## 2014-07-01 DIAGNOSIS — R300 Dysuria: Principal | ICD-10-CM | POA: Insufficient documentation

## 2014-07-01 DIAGNOSIS — R319 Hematuria, unspecified: Secondary | ICD-10-CM | POA: Insufficient documentation

## 2014-07-01 LAB — URINALYSIS
Bilirubin: NEGATIVE
Glucose: NEGATIVE
Ketones: NEGATIVE
Nitrite: NEGATIVE
Protein: NEGATIVE
Specific Gravity: 1.024 (ref 1.002–1.030)
pH: 5 (ref 5.0–8.0)

## 2014-07-01 MED ORDER — AZITHROMYCIN 250 MG OR TABS
ORAL_TABLET | ORAL | Status: AC
Start: 2014-07-01 — End: ?

## 2014-07-01 NOTE — Progress Notes (Signed)
Kristina Snyder is a 63 year old female who comes in for follow up of dysuria. Last culture was negative; she had a CT urogram for hematuria, which did not show stone.  She notes ongoing burning with urination, and bladder "heaviness".  Previously referred for urology, but never went.  Referral now expired.    She is also notes several days of respiratory infection.  Started with a sore throat, which has resolved.  Now with cough, chest tightness, associated with sweats and tactile fever. Cough is productive of thick, dark green sputum.  No known ill contacts.    Patient Active Problem List    Diagnosis Date Noted    Hypertension 11/03/2013    Hyperlipidemia 11/03/2013    Depression 11/03/2013    Anxiety 11/03/2013     Current Outpatient Prescriptions   Medication Sig    ARIPiprazole (ABILIFY) 5 MG tablet Take 0.5 tablets by mouth daily.    atorvastatin (LIPITOR) 10 MG tablet TAKE 1 TABLET BY MOUTH DAILY.              losartan (COZAAR) 100 MG tablet TAKE 1 TABLET BY MOUTH DAILY.    PARoxetine (PAXIL) 40 MG tablet Take 1 tablet by mouth daily.     OBJECTIVE:  BP 118/71 mmHg   Pulse 98   Temp(Src) 97.9 F (36.6 C) (Oral)   Wt 100.699 kg (222 lb)   SpO2 97%  Ill, but non-toxic. TMs clear. Nose/throat are clear. No adenopathy. Lungs with coarse breath sounds. + crackles mid-lung fields bilaterally. Rare wheeze. CV regular rate and rhythm; no murmur or gallop.  No CVAT. Abdomen benign; no mass.    ASSESSMENT:  Dysuria, r/o ICS, r/o bladder pathology given hematuria.  Bronchitis, r/o atypical pneumonia    PLAN:  Urology referral to complete evaluation.  Repeat UA today.  Rx azithromycin.  Symptomatic care of respiratory infection reviewed.  Return precautions reviewed.  Call/RTC otherwise prn.

## 2014-07-01 NOTE — Interdisciplinary (Signed)
Pre-visit chart review and huddle completed with staff and physician.    Outstanding labs, imaging and consults reviewed and identified.    Health maintanence issues identified and addressed:    Health Maintenance   Topic Date Due    COLON CANCER SCREENING WITH COLONOSCOPY  03/29/2002    INFLUENZA VACCINE  05/08/2014    BREAST CANCER SCREENING  10/08/2014    CERVICAL CANCER SCREENING  10/09/2015    IMM_TD/TDAP=>63 YO  01/28/2024

## 2014-08-09 ENCOUNTER — Encounter: Payer: Self-pay | Admitting: Obstetrics & Gynecology

## 2014-08-13 ENCOUNTER — Other Ambulatory Visit (INDEPENDENT_AMBULATORY_CARE_PROVIDER_SITE_OTHER): Payer: Self-pay | Admitting: Family Practice

## 2014-08-13 DIAGNOSIS — F32A Depression, unspecified: Secondary | ICD-10-CM

## 2014-08-13 DIAGNOSIS — F419 Anxiety disorder, unspecified: Principal | ICD-10-CM

## 2014-08-13 DIAGNOSIS — F329 Major depressive disorder, single episode, unspecified: Secondary | ICD-10-CM

## 2014-08-13 MED ORDER — PAROXETINE HCL 40 MG OR TABS
ORAL_TABLET | ORAL | Status: AC
Start: 2014-08-13 — End: ?

## 2014-08-13 NOTE — Telephone Encounter (Signed)
PARoxetine (PAXIL) 40 MG tablet [[id number]][[id number]]  Order Details    Dose: 40 mg Route: Oral Frequency: DAILY   Dispense Quantity:  30 tablet Refills:  5 Fills Remaining:  5          Sig: Take 1 tablet by mouth daily.         Written Date:  01/27/14 Expiration Date:  --     Start Date:  01/27/14 End Date:  --

## 2014-08-13 NOTE — Telephone Encounter (Signed)
I have never seen pt ever.  Refill Paxil. Please contact pt and inform her that Dr. Maxine GlennHedari is no longer at our office.  On review of Dr. Bertis RuddyHedari's notes, it may be helpful for her to be connected to therapist (through her insurance), or CC here at our office.  Please office this to her and see if she is interested.  She may return to see us in our office for anything related to this as well.   Thank you.      +++++++++++++++++++++++++++++++++++++++++++++++++++++++      Per Dr. Bertis RuddyHedari's notes:    "Paxil (20 years), Abilify (1-2 years, helped with anxiety attacks), "    And "Mood is ok  Recently last year cousin killed aunt, missing, now cousin is in jail  Stress from that  Has a lot of family support, not currently interested in counseling, no SI HI"

## 2014-08-20 ENCOUNTER — Other Ambulatory Visit (INDEPENDENT_AMBULATORY_CARE_PROVIDER_SITE_OTHER): Payer: Self-pay | Admitting: Family Medicine

## 2014-08-20 DIAGNOSIS — I10 Essential (primary) hypertension: Principal | ICD-10-CM

## 2014-08-20 MED ORDER — LOSARTAN POTASSIUM 100 MG OR TABS
100.0000 mg | ORAL_TABLET | Freq: Every day | ORAL | Status: AC
Start: 2014-08-20 — End: ?

## 2014-08-20 NOTE — Telephone Encounter (Signed)
Medication: losartan (COZAAR) 100 mg  Pharmacy: [facility] [id number]

## 2014-08-20 NOTE — Telephone Encounter (Signed)
Spoke with patient and informed.

## 2014-08-25 ENCOUNTER — Other Ambulatory Visit (INDEPENDENT_AMBULATORY_CARE_PROVIDER_SITE_OTHER): Payer: Self-pay | Admitting: Family Medicine

## 2014-08-25 DIAGNOSIS — E785 Hyperlipidemia, unspecified: Principal | ICD-10-CM

## 2014-08-25 NOTE — Telephone Encounter (Signed)
Pt calling requesting refill, pt states she has been out of med x 1 week,    atorvastatin (LIPITOR) 10 MG tablet [[id number]][[id number]] # 30  Sig: TAKE 1 TABLET BY MOUTH DAILY.    [id number] -- [address] -- [place], [place] [id number] -- [phone] -- [phone]    Patient has been advised there is 48-72 hour turnaround time for medication refill,

## 2014-08-26 MED ORDER — ATORVASTATIN CALCIUM 10 MG OR TABS
10.0000 mg | ORAL_TABLET | Freq: Every day | ORAL | Status: AC
Start: 2014-08-26 — End: ?

## 2014-08-26 NOTE — Telephone Encounter (Signed)
I have never seen or evaluated pt ever. Pt resident PCP, Dr. Lorina RabonHeidari no longer at this practice.  Refill. Labs reviewed.    Refill statin    Results for [name], Kristina (MRN [id number]) as of 05/10/2014 16:50   Ref. Range  01/27/2014 10:00    Sodium  Latest Range: 136-145 mmol/L  143    Potassium  Latest Range: 3.5-5.1 mmol/L  4.2    Chloride  Latest Range: 98-107 mmol/L  104    Bicarbonate  Latest Range: 22-29 mmol/L  26    Anion Gap  Latest Range: 7-15 mmol/L  13    BUN  Latest Range: 6-20 mg/dL  13    Creatinine  Latest Range: 0.51-0.95 mg/dL  7.820.90    GFR  No range found  >60    Glucose  Latest Range: 70-115 mg/dL  91    Calcium  Latest Range: 8.5-10.6 mg/dL  9.5    Alkaline Phos  Latest Range: 35-140 U/L  130    ALT (SGPT)  Latest Range: 0-33 U/L  8    AST (SGOT)  Latest Range: 0-32 U/L  18    Bilirubin, Tot  Latest Range: <1.20 mg/dL  9.560.65    ALBUMIN  Latest Range: 3.5-5.2 g/dL  4.2    Total Protein  Latest Range: 6.0-8.0 g/dL  7.5    Glyco Hgb (O1HA1C)  Latest Range: 4.8-5.9 %  5.8    Triglycerides  Latest Range: 10-170 mg/dL  086163    Cholesterol  Latest Range: <200 mg/dL  578154    HDL-Cholesterol  No range found  54    Non-HDL Cholesterol  No range found  100    LDL-Chol (Calc)  Latest Range: <160 mg/dL  67    TSH  Latest Range: 0.27-4.20 uIU/mL  1.35    Vitamin D, 25-OH D2  No range found  <5    Vitamin D, 25-OH D3  No range found  15    Vitamin D, 25-OH TOTAL  Latest Range: 30-80 ng/mL  15 (L)    WBC  Latest Range: 4.0-10.0 1000/mm3  6.6    RBC  Latest Range: 3.90-5.20 mill/mm3  4.65    Hgb  Latest Range: 11.2-15.7 gm/dL  46.914.0    Hct  Latest Range: 34.0-45.0 %  42.6    MCV  Latest Range: 79.0-95.0 um3  91.6    MCH  Latest Range: 26.0-32.0 pgm  30.1    MCHC  Latest Range: 32.0-36.0 %  32.9    RDW  Latest Range: 12.0-14.0 %  14.1 (H)    Plt Count  Latest Range: 480-436-3667/mm3  275    MPV  Latest Range: 9.4-12.4 fL  11.6    Segs  Latest Range: 34-71 %  58    Lymphocytes  Latest Range: 19-53 %  29    Monocytes  Latest  Range: 5-12 %  8    Eosinophils  Latest Range: <1-4 %  5 (H)    ANC-Automated  Latest Range: 1.6-7.0 1000/mm3  3.8    Abs Lymphs  Latest Range: 0.8-3.1 1000/mm3  1.9    Abs Monos  Latest Range: 0.2-0.8 1000/mm3  0.5    Abs Eosinophils  Latest Range: <0.1-0.5 1000/mm3  0.3    Diff Type  No range found  Automated

## 2014-09-14 ENCOUNTER — Encounter: Payer: Self-pay | Admitting: Internal Medicine

## 2014-09-14 ENCOUNTER — Ambulatory Visit (INDEPENDENT_AMBULATORY_CARE_PROVIDER_SITE_OTHER): Payer: Commercial Managed Care - PPO | Admitting: Internal Medicine

## 2014-09-14 VITALS — BP 124/80 | HR 73 | Temp 98.0°F | Ht 62.0 in | Wt 229.0 lb

## 2014-09-14 DIAGNOSIS — Z0000 Encounter for general adult medical examination without abnormal findings: Secondary | ICD-10-CM

## 2014-09-14 DIAGNOSIS — R7302 Impaired glucose tolerance (oral): Secondary | ICD-10-CM

## 2014-09-14 DIAGNOSIS — E7800 Pure hypercholesterolemia, unspecified: Secondary | ICD-10-CM

## 2014-09-14 MED ORDER — ARIPIPRAZOLE 5 MG PO TABS: 2.5000 mg | ORAL_TABLET | Freq: Every day | ORAL | Status: AC

## 2014-09-14 MED ORDER — LOSARTAN POTASSIUM 100 MG PO TABS: 100.0000 mg | ORAL_TABLET | Freq: Every day | ORAL | Status: AC

## 2014-09-14 MED ORDER — PAROXETINE HCL 40 MG PO TABS: 40.0000 mg | ORAL_TABLET | Freq: Every day | ORAL | Status: AC

## 2014-09-14 MED ORDER — ATORVASTATIN CALCIUM 10 MG PO TABS: 10.0000 mg | ORAL_TABLET | Freq: Every day | ORAL | Status: AC

## 2014-09-14 NOTE — Progress Notes (Signed)
Subjective:    Patient ID: Christina Copeland, female    DOB: 1951-02-09, 63 y.o.   MRN: [id number]  HPI    Here for wellness and f/u;  Overall doing ok;  Pt denies CP, worsening SOB, DOE, wheezing, orthopnea, PND, worsening LE edema, palpitations, dizziness or syncope.  Pt denies neurological change such as new headache, facial or extremity weakness.  Pt denies polydipsia, polyuria, or low sugar symptoms. Pt states overall good compliance with treatment and medications, good tolerability, and has been trying to follow lower cholesterol diet.  Pt denies worsening depressive symptoms, suicidal ideation or panic. No fever, night sweats, wt loss, loss of appetite, or other constitutional symptoms.  Pt states good ability with ADL's, has low fall risk, home safety reviewed and adequate, no other significant changes in hearing or vision, and only occasionally active with exercise. Moving permanently to [place].  No current commplaints Past Medical History  Diagnosis Date  . HYPERLIPIDEMIA 04/29/2007  . Overweight(278.02) 04/29/2007  . ANEMIA-NOS 11/24/2007  . ANXIETY 04/29/2007  . DEPRESSION 04/29/2007  . CARPAL TUNNEL SYNDROME, BILATERAL 04/29/2007  . HYPERTENSION 04/29/2007  . ASTHMATIC BRONCHITIS, ACUTE 11/04/2008  . ALLERGIC RHINITIS 11/24/2007  . GERD 04/29/2007  . UTI 09/06/2009  . JOINT EFFUSION, LEFT KNEE 06/18/2008  . KNEE PAIN, RIGHT 06/18/2008  . BACK PAIN 09/06/2008  . COCCYGEAL PAIN 02/21/2010  . LEG PAIN, LEFT 09/06/2009  . HYPERSOMNIA 11/24/2007  . FATIGUE 11/24/2007  . Palpitations 09/13/2010  . DYSPNEA 11/16/2008  . CHEST PAIN 11/16/2008  . Other abnormal glucose 11/25/2007  . SPRAIN AND STRAIN OF LUMBOSACRAL 11/01/2009  . COLONIC POLYPS, HX OF 04/29/2007  . Impaired glucose tolerance 01/29/2011  . Vitamin D deficiency 01/02/2012   Past Surgical History  Procedure Laterality Date  . Cesarean section    . S/p abdominoplasty with multi revisions    . S/p gastric bypass  2010    paid cash  in [place]  . Colonoscopy    . Polypectomy      reports that she has been smoking Cigarettes.  She has been smoking about 0.50 packs per day. She has never used smokeless tobacco. She reports that she does not drink alcohol or use illicit drugs. family history includes Breast cancer in her maternal aunt; Cancer in her other; Colon cancer in her maternal uncle; Heart disease in her father and other; Prostate cancer in her father. Allergies  Allergen Reactions  . Sulfonamide Derivatives    Current Outpatient Prescriptions on File Prior to Visit  Medication Sig Dispense Refill  . ABILIFY 5 MG tablet TAKE ONE-HALF (1/2) TABLET DAILY 45 tablet 2  . atorvastatin (LIPITOR) 10 MG tablet Take 1 tablet (10 mg total) by mouth daily. 90 tablet 0  . losartan (COZAAR) 100 MG tablet Take 1 tablet (100 mg total) by mouth daily. 90 tablet 0  . PARoxetine (PAXIL) 40 MG tablet TAKE 1 TABLET DAILY 90 tablet 2   No current facility-administered medications on file prior to visit.   Review of Systems Constitutional: Negative for increased diaphoresis, other activity, appetite or other siginficant weight change  HENT: Negative for worsening hearing loss, ear pain, facial swelling, mouth sores and neck stiffness.   Eyes: Negative for other worsening pain, redness or visual disturbance.  Respiratory: Negative for shortness of breath and wheezing.   Cardiovascular: Negative for chest pain and palpitations.  Gastrointestinal: Negative for diarrhea, blood in stool, abdominal distention or other pain Genitourinary: Negative for hematuria, flank pain or change  in urine volume.  Musculoskeletal: Negative for myalgias or other joint complaints.  Skin: Negative for color change and wound.  Neurological: Negative for syncope and numbness. other than noted Hematological: Negative for adenopathy. or other swelling Psychiatric/Behavioral: Negative for hallucinations, self-injury, decreased concentration or other  worsening agitation.      Objective:   Physical Exam BP 124/80 mmHg  Pulse 73  Temp(Src) 98 F (36.7 C) (Oral)  Ht 5\' 2"  (1.575 m)  Wt 229 lb (103.874 kg)  BMI 41.87 kg/m2  SpO2 97% VS noted,  Constitutional: Pt is oriented to person, place, and time. Appears well-developed and well-nourished. /morbid obese Head: Normocephalic and atraumatic.  Right Ear: External ear normal.  Left Ear: External ear normal.  Nose: Nose normal.  Mouth/Throat: Oropharynx is clear and moist.  Eyes: Conjunctivae and EOM are normal. Pupils are equal, round, and reactive to light.  Neck: Normal range of motion. Neck supple. No JVD present. No tracheal deviation present.  Cardiovascular: Normal rate, regular rhythm, normal heart sounds and intact distal pulses.   Pulmonary/Chest: Effort normal and breath sounds without rales or wheezing  Abdominal: Soft. Bowel sounds are normal. NT. No HSM  Musculoskeletal: Normal range of motion. Exhibits no edema.  Lymphadenopathy:  Has no cervical adenopathy.  Neurological: Pt is alert and oriented to person, place, and time. Pt has normal reflexes. No cranial nerve deficit. Motor grossly intact Skin: Skin is warm and dry. No rash noted.  Psychiatric:  Has normal mood and affect. Behavior is normal.    Wt Readings from Last 3 Encounters:  09/14/14 229 lb (103.874 kg)  08/26/12 212 lb 12.8 oz (96.525 kg)  08/20/12 213 lb (96.616 kg)        Assessment & Plan:

## 2014-09-14 NOTE — Progress Notes (Signed)
Pre visit review using our clinic review tool, if applicable. No additional management support is needed unless otherwise documented below in the visit note. 

## 2014-09-14 NOTE — Patient Instructions (Signed)
Please continue all other medications as before, and refills have been done if requested.  Please have the pharmacy call with any other refills you may need.  Please continue your efforts at being more active, low cholesterol diet, and weight control.  You are otherwise up to date with prevention measures today.  Please keep your appointments with your specialists as you may have planned  Please go to the LAB in the Basement (turn left off the elevator) for the tests to be done today  You will be contacted by phone if any changes need to be made immediately.  Otherwise, you will receive a letter about your results with an explanation, but please check with MyChart first.  Please remember to sign up for MyChart if you have not done so, as this will be important to you in the future with finding out test results, communicating by private email, and scheduling acute appointments online when needed.  Christina Copeland in [facility]!

## 2014-09-14 NOTE — Assessment & Plan Note (Signed)

## 2014-09-14 NOTE — Assessment & Plan Note (Signed)
stable overall by history and exam, recent data reviewed with pt, and pt to continue medical treatment as before,  to f/u any worsening symptoms or concerns Lab Results  Component Value Date   HGBA1C 6.6* 11/25/2007

## 2014-09-15 ENCOUNTER — Telehealth: Payer: Self-pay | Admitting: Internal Medicine

## 2014-09-15 NOTE — Telephone Encounter (Signed)
emmi mailed  °

## 2014-11-01 ENCOUNTER — Telehealth (INDEPENDENT_AMBULATORY_CARE_PROVIDER_SITE_OTHER): Payer: Self-pay | Admitting: Family Practice

## 2014-11-01 NOTE — Telephone Encounter (Signed)
Received PAR request on pt's Aripiprazole.    Pt now has [employer].  I advised pt to call her pharmacy and have them fax request to proper Doctor's office.

## 2014-11-04 ENCOUNTER — Other Ambulatory Visit (INDEPENDENT_AMBULATORY_CARE_PROVIDER_SITE_OTHER): Payer: Self-pay | Admitting: Family Medicine

## 2014-11-04 ENCOUNTER — Telehealth: Payer: Self-pay | Admitting: Internal Medicine

## 2014-11-04 DIAGNOSIS — Z1231 Encounter for screening mammogram for malignant neoplasm of breast: Principal | ICD-10-CM

## 2014-11-04 NOTE — Telephone Encounter (Signed)
I have received the form.  Offer alternative or proceed with PA

## 2014-11-04 NOTE — Telephone Encounter (Signed)
Ok for PA - for use as adjunct treatment to previously failed treatment on SSRI

## 2014-11-04 NOTE — Telephone Encounter (Signed)
Pt called in said that pharmacy is looking for a PA on ARIPiprazole (ABILIFY) 5 MG tablet [[id number]]

## 2014-11-04 NOTE — Telephone Encounter (Signed)
I will forward to Shirlean Mylar, thanks

## 2014-11-05 NOTE — Telephone Encounter (Signed)
I called the 800 number to initiate PA for this medication.  They stated only the patients husband is on this plan and he would need to call the insurance to have this patient (wife) added if this is an error.  I did call the wife to inform of instructions.  She did understand and agreed to contact her husband to have this corrected.  At this time will close telephone note until notified further this has been taken care of as cannot do anything further on PA at this time.

## 2014-11-10 ENCOUNTER — Telehealth (INDEPENDENT_AMBULATORY_CARE_PROVIDER_SITE_OTHER): Payer: Self-pay | Admitting: Family Medicine

## 2014-11-10 NOTE — Telephone Encounter (Signed)
Pt is no longer a pt in our office.  Insurance is switched to [employer]

## 2014-11-25 ENCOUNTER — Encounter: Payer: Self-pay | Admitting: *Deleted

## 2014-11-25 DIAGNOSIS — F329 Major depressive disorder, single episode, unspecified: Secondary | ICD-10-CM | POA: Insufficient documentation

## 2014-11-25 DIAGNOSIS — F32A Depression, unspecified: Secondary | ICD-10-CM | POA: Insufficient documentation

## 2015-06-21 ENCOUNTER — Encounter: Payer: Self-pay | Admitting: Internal Medicine

## 2015-11-29 ENCOUNTER — Encounter: Payer: Self-pay | Admitting: Internal Medicine

## 2016-08-14 ENCOUNTER — Other Ambulatory Visit (INDEPENDENT_AMBULATORY_CARE_PROVIDER_SITE_OTHER): Payer: Self-pay | Admitting: Family Medicine

## 2016-08-14 DIAGNOSIS — Z1239 Encounter for other screening for malignant neoplasm of breast: Principal | ICD-10-CM

## 2016-11-02 ENCOUNTER — Other Ambulatory Visit (INDEPENDENT_AMBULATORY_CARE_PROVIDER_SITE_OTHER): Payer: Self-pay | Admitting: Family Medicine

## 2016-11-02 DIAGNOSIS — Z1239 Encounter for other screening for malignant neoplasm of breast: Principal | ICD-10-CM

## 2017-04-15 ENCOUNTER — Encounter (INDEPENDENT_AMBULATORY_CARE_PROVIDER_SITE_OTHER): Payer: Self-pay | Admitting: Family Medicine

## 2017-04-15 DIAGNOSIS — Z13820 Encounter for screening for osteoporosis: Principal | ICD-10-CM

## 2019-02-02 ENCOUNTER — Encounter (INDEPENDENT_AMBULATORY_CARE_PROVIDER_SITE_OTHER): Payer: Self-pay

## 2019-10-29 ENCOUNTER — Ambulatory Visit: Payer: Self-pay

## 2019-10-29 DIAGNOSIS — Z23 Encounter for immunization: Secondary | ICD-10-CM

## 2019-11-26 ENCOUNTER — Ambulatory Visit: Payer: Self-pay

## 2019-11-26 DIAGNOSIS — Z23 Encounter for immunization: Secondary | ICD-10-CM

## 2021-01-14 ENCOUNTER — Encounter (INDEPENDENT_AMBULATORY_CARE_PROVIDER_SITE_OTHER): Payer: Self-pay | Admitting: Hospital

## 2023-10-15 ENCOUNTER — Ambulatory Visit (INDEPENDENT_AMBULATORY_CARE_PROVIDER_SITE_OTHER): Admitting: Neurology

## 2023-10-15 ENCOUNTER — Encounter (INDEPENDENT_AMBULATORY_CARE_PROVIDER_SITE_OTHER): Payer: Self-pay | Admitting: Neurology

## 2023-10-15 ENCOUNTER — Telehealth (INDEPENDENT_AMBULATORY_CARE_PROVIDER_SITE_OTHER): Payer: Self-pay | Admitting: Neurology

## 2023-10-15 VITALS — BP 134/84 | HR 67 | Ht 62.0 in | Wt 247.8 lb

## 2023-10-15 MED ORDER — IRON 27 PO: 60.0000 mg | Freq: Every day | ORAL | Status: AC

## 2023-10-15 MED ORDER — ASPIRIN 81 MG OR TBEC: 81.0000 mg | DELAYED_RELEASE_TABLET | Freq: Every day | ORAL | Status: AC

## 2023-10-15 NOTE — Telephone Encounter (Signed)
10/15/2023   Gave patient copy of lab work and asked pt to make a f/u appt. With Dr. Bluford Kaufmann once we call to schedule the EMG /EA

## 2023-10-15 NOTE — Patient Instructions (Addendum)
MRI lumbar spine - open MRI  EMG/NCV of the legs  Monitor tremor  Labwork   MRI brain - open MRI  OV after tests.

## 2023-10-15 NOTE — Progress Notes (Signed)
 10/15/23      Re: Kristina Snyder  DOB: 10-08-1951      History of Present Illness:  Kristina Snyder is a 73 year old female, who presents as a consultation from Dr. Maceo for tremor.    At her baseline, she describes herself as inactive.  No neck or back pain.  Has pain at the right hip and right upper leg.  She is very cautious about her balance, no recent falls.  Had a mechanical fall 5 yrs ago.    2 yrs ago, she started noticing right hand tremor.  Worse with activities.  She notices it when holding a cigarette.  Has noticed a chin tremor.  Takes smaller steps.  Father with tremor, not Parkinson's disease.  Tremor not bothersome enough that she would want a new med.    Has sleep apnea, unable to tolerate CPAP.  Has early morning awakening - up from 2-4AM.  No acting out of dreams.  She has noticed memory issues.  May forget names and places.  She is independent with all her ADLs.  She has a history of bipolar disorder.  Mood is good.  Takes Abilify  5 mg daily and Paxil  40 mg daily.  Mood managed by PCP.    Has noticed sweating.    Past Medical History:  Hypertension  Hyperlipidemia  Prediabetes  RLS - Resolved after taking iron .  Carpal tunnel syndrome - Not an active problem.  Migraines - In remote past, not an active problem.    Osteoarthritis of the right knee  Bipolar disorder, depression - Takes Abilify  5 mg daily and Paxil  40 mg daily.  Mood managed by PCP.  Sleep apnea - Does not want to try CPAP.    Surgeries:  C-section  Gastric bypass  Breast reduction  Tummy tuck    Medications:   ARIPiprazole  Take 0.5 tablets by mouth daily. 30 tablet 5    aspirin  Take 1 tablet (81 mg) by mouth daily.      atorvastatin  Take 1 tablet (10 mg) by mouth daily. 90 tablet 0    azithromycin  Take 2 tablets by mouth on day 1, then take 1 tablet daily on days 2-5. 6 tablet 0    Ferrous Gluconate (IRON  27 PO) Take 60 mg by mouth daily.      losartan  Take 1 tablet (100 mg) by mouth daily. 30 tablet 2    PARoxetine  TAKE 1 TABLET BY  MOUTH DAILY. 30 tablet 5     Allergies:  Allergies   Allergen Reactions    Cephalexin  Unspecified     Family History:  Father with CAD and prostate cancer.  Mother with dementia.    Social History:  Smokes 1 ppd.  No alcohol use.    Review of Systems:  As per patient questionnaire.    Physical Exam:  Vitals: BP 134/84 (BP Location: Left arm, BP Patient Position: Sitting, BP cuff size: Large)   Pulse 67   Ht 5' 2 (1.575 m)   Wt 112.4 kg (247 lb 12.8 oz)   BMI 45.32 kg/m   General: Well-developed, well-nourished. In no acute distress. Neck: FROM.  Tight muscles.  Neurological: Mental Status: Alert. Speech: Fluent, no dysarthria. Comprehension is intact. Cranial Nerves: Pupils are equal, round, and reactive to light. Extraocular muscles are intact. Sensation is equal and intact to light touch in V1 through V3 distribution bilaterally. Symmetric face. Hearing is intact bilaterally. Symmetric elevation of the palate. Normal shoulder shrug bilaterally. Tongue protrudes in the midline.  Motor: Strength is 5/5 throughout.  Mild chin tremor.  Mild action/postural tremor of the hands, worse on the right.  No rest tremor.  Very mild bradykinesia bilaterally, as demonstrated by hand opening-closing and finger tapping.  Tone is mildly increased bilaterally with reinforcement.  Sensation: Temperature is equal and intact throughout.  Pinprick is mildly reduced at the right leg.  Vibration is absent throughout the left leg and reduced on the right.  Reflexes: 2+ throughout. Downgoing toes bilaterally. Coordination: No dysmetria. No dysdiadochokinesia. Gait and Station: Steady gait.  Shortened stride.  Mild tremor emerges on the right as she walks.  Mildly unsteady with Romberg testing, eyes closed.     Studies:  X-ray of the lumbar spine (4/22) -   CONCLUSION:   Multilevel anterior spondylosis of lumbar spine with mild degenerative disc disease and facet arthropathy at L4-5 and L5-S1.     Assessment/Plan:  Kristina Snyder is a  73 year old female, who presents for tremor.    She reports pain at the right hip and right upper leg.  On exam, she has sensory loss at the legs.  She has noticed gait imbalance.  Suspect at least partly due to sensory ataxia.  Ordering MRI lumbar spine and EMG/NCV to evaluate for lumbar radiculopathy vs. peripheral neuropathy.    2 yrs ago, she started noticing right hand tremor with activities and chin tremor.  She also reports taking smaller steps.  On exam, she has mild chin tremor and mild action/postural tremor of the hands.  Appears most consistent with essential tremor.  She also has very mild extrapyramidal features, with bilateral bradykinesia and rigidity.  Ordering labwork and MRI brain for further evaluation.      She has sleep apnea, unable to tolerate CPAP.  She also has early morning awakening.  She reports memory issues, but she is independent with all her ADLs.  Will perform cognitive screening in the future.  She has a history of bipolar disorder.  Mood is good.  Takes Abilify  5 mg daily and Paxil  40 mg daily.     She has a history of restless legs syndrome that resolved with iron  supplementation.  She has a history of Carpal Tunnel Syndrome and migraines - not active problems.    I will have her return for a visit after her tests.  She may call sooner should new questions or concerns arise.         Lynae was seen today for tremor.    Diagnoses and all orders for this visit:    Sensory loss  -     EMG/NCV    Abnormality of gait  -     EMG/NCV    Lumbar radiculopathy  -     MRI Lumbar Spine WO; Future    Tremor  -     MRI Brain WO; Future    Extrapyramidal syndrome  -     MRI Brain WO; Future  -     ANA (Anti-Nuclear Antibody); Future  -     Ceruloplasmin Yellow serum separator tube; Future  -     C-Reactive Protein, Blood Green Plasma Separator Tube; Future  -     Sedimentation Rate (ESR), Blood Lavender; Future  -     TSH, Blood; Future  -     ANA (Anti-Nuclear Antibody)  -     Ceruloplasmin  Yellow serum separator tube  -     C-Reactive Protein, Blood Green Plasma Separator Tube  -  Sedimentation Rate (ESR), Blood Lavender  -     TSH, Blood         No follow-ups on file.     Signature:  Electronically signed by: Johnston Delon Piedmont, MD   Signature Derived From Controlled Access Password, October 17, 2023, 8:24 AM      Time spent for this visit was 60 minutes, including chart review, face-to-face time, charting, and care coordination.

## 2023-10-15 NOTE — Interdisciplinary (Signed)
 I, Ranell Patrick have reviewed medications and allergies with the patient.

## 2023-10-16 ENCOUNTER — Other Ambulatory Visit (INDEPENDENT_AMBULATORY_CARE_PROVIDER_SITE_OTHER): Payer: Self-pay | Admitting: Neurology

## 2023-10-23 LAB — SED RATE, BLOOD: Sedimentation Rate-Westergren: 11 mm/h (ref 0–40)

## 2023-10-23 LAB — TSH, BLOOD: TSH: 1.05 u[IU]/mL (ref 0.450–4.500)

## 2023-10-23 LAB — ANTINUCLEAR ANTIBODIES DIRECT - LABCORP: ANA Direct: NEGATIVE

## 2023-10-23 LAB — CERULOPLASMIN: Ceruloplasmin: 26 mg/dL (ref 19.0–39.0)

## 2023-10-23 LAB — C-REACTIVE PROTEIN, BLOOD: C-Reactive Protein, Quant: 3 mg/L (ref 0–10)

## 2023-11-06 ENCOUNTER — Encounter (INDEPENDENT_AMBULATORY_CARE_PROVIDER_SITE_OTHER): Payer: Self-pay | Admitting: Neurology

## 2023-11-06 ENCOUNTER — Ambulatory Visit (INDEPENDENT_AMBULATORY_CARE_PROVIDER_SITE_OTHER): Admitting: Neurology

## 2023-11-06 VITALS — Ht 62.0 in | Wt 248.0 lb

## 2023-11-06 NOTE — Interdisciplinary (Signed)
 IChanetta Snyder, Roomed and Reviewed Patient's Medication and Allergies.

## 2023-11-06 NOTE — Procedures (Signed)
 Shela Leff, PhD.  Dorena Bodo, D.O.  Jake Church, M.D.  Doran Stabler, M.D.  Rachelle Hora, M.D.  Lamonte Richer, M.D.  Leilani Merl, M.D.  Maxwell Caul. Fara Olden, M.D.   Cherene Julian, M.D.  Rodolph Bong, MD  Shaune Spittle, PhD.   Laurel Dimmer, MD  Truddie Coco, M.D.   Donell Sievert, D.O.  Ladell Heads, M.D.  Mardelle Matte, M.D.  Marsh Dolly, D.O.  Carmin Muskrat, M.D.  Carren Rang, D.O.  Lanier Ensign, M.D.   Bryan Lemma, D.O. Whitney Muse, Ph.D.  Tonie Griffith, M.D.  Nestor Ramp, M.D.  Rushie Goltz, M.D.  Rosine Beat. Schim, M.D.  Latina Craver, MD  Bertell Maria, M.D.  Havery Moros, M.D.  Laverna Peace, M.D.  Philemon Kingdom, M.D.  Joylene Draft, M.D.    Ihor Austin. [place], M.D., Ph.D. Christin Bach Bishop, N.P.  Jeanelle Malling, P.A.-C  Belva Agee, P.A.-C.  Heywood Bene, P.A.-C  Amado Nash, P.A.-C  Barbarann Ehlers, P.A.-C  Tyron Russell, N.P.         Patient: Kristina Snyder ID#: [id number] Test Date 11/06/2023   DOB: Dec 05, 1950 Weight: 248 lbs. Physician: Laurel Dimmer, MD   AGE: 73 year Height: 5\' 2"  Ref Phys: Carmin Muskrat, MD   Sex: Female   Technician: Laurel Dimmer, MD     Patient History:  The patient is a 73 year old female here for bilateral lower extremity nerve conduction velocity/electromyography testing to evaluate for peripheral neuropathy. No diabetes.     Impression:   ABNORMAL bilateral lower extremity nerve conduction velocity study and left lower extremity electromyography study; ABNORMAL bilateral superficial radial sensory nerve conduction velocity study; NORMAL right lower extremity electromyography study.  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 Note that needle EMG was not performed in bilateral distal (foot) muscles to assess for distal motor axonal component, but bilateral tibial (AH) and deep peroneal (EDB) CMAPs were generous/normal in  size.  There is some electrodiagnostic evidence of a prior left peroneal neuropathy, level indeterminate, likely at the knee, with reinnervation, but no active denervation. Alternatively, another explanation for reinnervation changes in left peroneal supplied muscles could be a prior left L5 radiculopathy.   There is no electrodiagnostic support for focal right peroneal or bilateral tibial or sciatic neuropathies.   There is no electrodiagnostic evidence of myopathy in the bilateral lower extremities.     Comment:  The results were briefly discussed with the patient. Clinical correlation recommended. Advised to follow up with the requesting provider concerning today's testing and for further management.     _______________________________  Laurel Dimmer, M.D.   Certified, American Academy of Psychiatry and Neurology - Neurology    Time out was performed. All EMG was performed by physician.          EMG Findings:  EMG of the Left anterior tibialis showed increased motor unit amplitude, moderately increased polyphasic potentials, and reduced recruitment recruitment.  The Left peroneus longus showed increased motor unit amplitude, slightly increased polyphasic potentials, and reduced recruitment recruitment.  The Left medial gastrocnemius, the Left lateral gastrocnemius, the Right medial gastrocnemius, the Right peroneus longus, and the Right lateral gastrocnemius showed reduced recruitment recruitment (likely due to discomfort).  The Right anterior tibialis, the Right vastus medialis, and the Left vastus medialis were normal.    NCV Findings:  The Left radial antidromic sensory nerve showed reduced O-P and P-T SNAP amplitudes.  The Right radial antidromic sensory nerve showed  reduced P-T amplitude, but normal O-P SNAP amplitude.  The Left superficial peroneal antidromic sensory nerve showed no response (Lat Calf).  The Left sural antidromic sensory nerve showed no response (Post-Lat Calf).  The Left peroneal motor  nerve was normal.  The Right peroneal motor nerve was normal.  The Left tibial motor nerve was normal.  The Right tibial motor nerve was normal.  The Right superficial peroneal antidromic sensory nerve was normal.  The Right sural antidromic sensory nerve was normal, aside from reduced O-P SNAP amplitude, but normal P-T SNAP amplitude.  Bilateral tibial F Wave latencies were within normal limits.  Bilateral tibial H-reflex latencies were normal.    Nerve Conduction Studies  Anti Sensory Summary Table     Stim Site NR Onset (ms) Norm Onset (ms) Peak (ms) Norm Peak (ms) Norm O-P Amp P-T Amp (V) Norm P-T Amp Site1 Site2 Dist (cm) Vel (m/s) Norm Vel (m/s)   Left Radial Anti Sensory (Base 1st Digit)  31.8 C   Wrist    1.5 </=2 1.9 </=2.9 >/=10 14.8 >15 Wrist Base 1st Digit 10 67 >/=50   Right Radial Anti Sensory (Base 1st Digit)  33.6 C   Wrist    1.7 </=2 2.3 </=2.9 >/=10 10.5 >15 Wrist Base 1st Digit 10 59 >/=50   Left Superficial Peroneal Anti Sensory (Ant Lat Mall)  31.1 C   Lat Calf NR    <4.6   >4.0 Lat Calf Ant Lat Mall 12  >39   Right Superficial Peroneal Anti Sensory (Ant Lat Mall)  30.8 C   Lat Calf    2.5  2.9 <4.6  6.4 >4.0 Lat Calf Ant Lat Mall 12 48 >39   Left Sural Anti Sensory (Lat Mall)  31.1 C   Post-Lat Calf NR  </=3.5  </=4.5 >/=5  >5.0 Post-Lat Calf Lat Mall 14  >/=40   Right Sural Anti Sensory (Lat Mall)  30.8 C   Post-Lat Calf    3.4 </=3.5 3.6 </=4.5 >/=5 7.2 >5.0 Post-Lat Calf Lat Mall 14 41 >/=40     Motor Summary Table     Stim Site NR Onset (ms) Norm Onset (ms) O-P Amp (mV) Norm O-P Amp Neg Dur (ms) Site1 Site2 Delta-0 (ms) Dist (cm) Vel (m/s) Norm Vel (m/s)   Left Peroneal Motor (Ext Dig Brev)  31 C   Ankle    4.2 </=6.6 5.8 >/=2.6 6.41 Ankle Ext Dig Brev 4.2 9     B Fib    10.0  3.7 >/=2 6.41 B Fib Ankle 5.8 28.5 49 >/=39   Poplt    12.5  4.5 >/=1.8 6.09 Poplt B Fib 2.5 10 40 > (BF-A)-15   Right Peroneal Motor (Ext Dig Brev)  30.2 C   Ankle    4.5 </=6.6 6.4 >/=2.6 5.94 Ankle Ext  Dig Brev 4.5 9     B Fib    10.0  6.2 >/=2 6.25 B Fib Ankle 5.5 27.2 49 >/=39   Poplt    12.0  5.7 >/=1.8 6.41 Poplt B Fib 2.0 10 50 > (BF-A)-15   Left Tibial Motor (Abd Hall Brev)  33.1 C   Ankle    3.9 </=6 9.2 >/=5.2 5.78 Ankle Abd Hall Brev 3.9 8     Knee    10.9  7.5  6.41 Knee Ankle 7.0 36.8 53 >/=40   Right Tibial Motor (Abd Hall Brev)  32.8 C   Ankle    4.4 </=6 6.3 >/=5.2 5.47 Ankle Abd  Hall Brev 4.4 8     Knee    12.0  6.0  6.25 Knee Ankle 7.6 33.5 44 >/=40     F Wave Studies     NR F-Lat (ms) Lat Norm (ms) L-R F-Lat (ms) M-Lat (ms) FLat-MLat (ms)   Left Tibial (Mrkrs) (Abd Hallucis)  32.9 C    A-waves noted.       45.24 </=59 1.07 4.00 41.24   Right Tibial (Mrkrs) (Abd Hallucis)  32.6 C      44.17 </=59 1.07 4.50 39.67     H Reflex Studies     NR H-Lat (ms) L-R H-Lat (ms) L-R Lat Norm M-Lat (ms) HLat-MLat (ms)   Left Tibial (Gastroc)  32.6 C    Lower leg length 36cm      29.19 1.29 <2.0     Right Tibial (Gastroc)  32.3 C      30.48 1.29 <2.0 4.67 25.81     EMG     Side Muscle Nerve Root Ins Act Fibs Psw Fasic Amp Dur Poly Recrt Comment   Left AntTibialis Dp Br Fibular L4-5 Nml Nml Nml None Incr Nml 2+ Decr # Firing Slow    Left Peroneus Long Sup Br Fibular L5-S1 Nml Nml Nml None Incr Nml 1+ Decr # Firing Slow    Left MedGastroc Tibial S1-2 Nml Nml Nml None Nml Nml 0 Decr # Firing Slow    Left LatGastroc Tibial S1-2 Nml Nml Nml None Nml Nml 0 Decr # Firing Slow    Right AntTibialis Dp Br Fibular L4-5 Nml Nml Nml None Nml Nml 0 Nml    Right MedGastroc Tibial S1-2 Nml Nml Nml None Nml Nml 0 Decr # Firing Slow    Right VastusMed Femoral L2-4 Nml Nml Nml None Nml Nml 0 Nml    Right Peroneus Long Sup Br Fibular L5-S1 Nml Nml Nml None Nml Nml 0 Decr # Firing Slow    Right LatGastroc Tibial S1-2 Nml Nml Nml None Nml Nml 0 Decr # Firing Slow    Left VastusMed Femoral L2-4 Nml Nml Nml None Nml Nml 0 Nml      Waveforms:

## 2023-11-25 NOTE — Patient Instructions (Signed)
 Seen for electrodiagnostic studies in the bilateral lower extremities. Advised to follow up with requesting provider for discussion of results and further management.

## 2023-11-25 NOTE — Procedures (Incomplete)
 Shela Leff, PhD.  Dorena Bodo, D.O.  Jake Church, M.D.  Doran Stabler, M.D.  Rachelle Hora, M.D.  Lamonte Richer, M.D.  Leilani Merl, M.D.  Maxwell Caul. Fara Olden, M.D.   Cherene Julian, M.D.  Rodolph Bong, MD  Shaune Spittle, PhD.   Laurel Dimmer, MD  Truddie Coco, M.D.   Donell Sievert, D.O.  Ladell Heads, M.D.  Mardelle Matte, M.D.  Marsh Dolly, D.O.  Carmin Muskrat, M.D.  Carren Rang, D.O.  Lanier Ensign, M.D.   Bryan Lemma, D.O. Whitney Muse, Ph.D.  Tonie Griffith, M.D.  Nestor Ramp, M.D.  Rushie Goltz, M.D.  Rosine Beat. Schim, M.D.  Latina Craver, MD  Bertell Maria, M.D.  Havery Moros, M.D.  Laverna Peace, M.D.  Philemon Kingdom, M.D.  Joylene Draft, M.D.    Ihor Austin. [place], M.D., Ph.D. Christin Bach Bishop, N.P.  Jeanelle Malling, P.A.-C  Belva Agee, P.A.-C.  Heywood Bene, P.A.-C  Amado Nash, P.A.-C  Barbarann Ehlers, P.A.-C  Tyron Russell, N.P.         Patient: Kristina Snyder ID#: [id number] Test Date 11/06/2023   DOB: March 15, 1951 Weight: 248 lbs. Physician: Laurel Dimmer, MD   AGE: 73 year Height: 5\' 2"  Ref Phys: Carmin Muskrat, MD   Sex: Female   Technician: Laurel Dimmer, MD     Patient History:  The patient is a 74 year old female here for bilateral lower extremity nerve conduction velocity/electromyography testing to evaluate for peripheral neuropathy. No diabetes. Past hemoglobin A1c was 5.8 on 01/27/14.    Impression:       Comment:      _______________________________  Laurel Dimmer, M.D.   Certified, [employer] - Neurology    Time out was performed. All EMG was performed by physician.          EMG Findings:  EMG of the Left anterior tibialis showed increased motor unit amplitude, moderately increased polyphasic potentials, and reduced recruitment recruitment.  The Left peroneus longus showed increased motor unit amplitude, slightly increased polyphasic potentials, and reduced recruitment recruitment.  The Left medial gastrocnemius,  the Left lateral gastrocnemius, the Right medial gastrocnemius, the Right peroneus longus, and the Right lateral gastrocnemius showed reduced recruitment recruitment.  The Right anterior tibialis, the Right vastus medialis, and the Left vastus medialis were normal.    NCV Findings:  The Left radial sensory nerve showed reduced amplitude.  The Right radial sensory nerve showed reduced amplitude.  The Left Superficial Peroneal sensory nerve showed no response (Lat Calf).  The Left sural sensory nerve showed no response (Post-Lat Calf).  The Left peroneal motor nerve was normal.  The Right peroneal motor nerve was normal.  The Left tibial motor nerve was normal.  The Right tibial motor nerve was normal.  The Right Superficial Peroneal sensory nerve was normal.  The Right sural sensory nerve was normal.  All F Wave latencies were within normal limits.      Nerve Conduction Studies  Anti Sensory Summary Table     Stim Site NR Onset (ms) Norm Onset (ms) Peak (ms) Norm Peak (ms) P-T Amp (V) Norm P-T Amp Site1 Site2 Dist (cm) Vel (m/s) Norm Vel (m/s)   Left Radial Anti Sensory (Base 1st Digit)  31.8 C   Wrist    1.5  1.9 <2.7 14.8 >15 Wrist Base 1st Digit 10.0 67 >49   Right Radial Anti Sensory (Base 1st Digit)  33.6 C   Wrist    1.7  2.3 <2.7 10.5 >15 Wrist  Base 1st Digit 10.0 59 >49   Left Superficial Peroneal Anti Sensory (Ant Lat Mall)  31.1 C   Lat Calf NR    <4.6  >4.0 Lat Calf Ant Lat Mall 12.0  >39   Right Superficial Peroneal Anti Sensory (Ant Lat Mall)  30.8 C   Lat Calf    2.5  2.9 <4.6 6.4 >4.0 Lat Calf Ant Lat Mall 12.0 48 >39   Left Sural Anti Sensory (Lat Mall)  31.1 C   Post-Lat Calf NR  <3.6  <4.4  >5.0 Post-Lat Calf Lat Mall 14.0  >39   Right Sural Anti Sensory (Lat Mall)  30.8 C   Post-Lat Calf    3.4 <3.6 3.6 <4.4 7.2 >5.0 Post-Lat Calf Lat Mall 14.0 41 >39     Motor Summary Table     Stim Site NR Onset (ms) Norm Onset (ms) O-P Amp (mV) Norm O-P Amp Neg Dur (ms) Site1 Site2 Delta-0 (ms) Dist (cm)  Vel (m/s) Norm Vel (m/s)   Left Peroneal Motor (Ext Dig Brev)  31 C   Ankle    4.2 <6.1 5.8 >2.0 6.41 Ankle Ext Dig Brev 4.2 9.0     B Fib    10.0  3.7  6.41 B Fib Ankle 5.8 28.5 49 >39   Poplt    12.5  4.5  6.09 Poplt B Fib 2.5 10.0 40 >39   Right Peroneal Motor (Ext Dig Brev)  30.2 C   Ankle    4.5 <6.1 6.4 >2.0 5.94 Ankle Ext Dig Brev 4.5 9.0     B Fib    10.0  6.2  6.25 B Fib Ankle 5.5 27.2 49 >39   Poplt    12.0  5.7  6.41 Poplt B Fib 2.0 10.0 50 >39   Left Tibial Motor (Abd Hall Brev)  33.1 C   Ankle    3.9 <6.0 9.2 >2.0 5.78 Ankle Abd Hall Brev 3.9 8.0     Knee    10.9  7.5  6.41 Knee Ankle 7.0 36.8 53 >39   Right Tibial Motor (Abd Hall Brev)  32.8 C   Ankle    4.4 <6.0 6.3 >2.0 5.47 Ankle Abd Hall Brev 4.4 8.0     Knee    12.0  6.0  6.25 Knee Ankle 7.6 33.5 44 >39     F Wave Studies     NR F-Lat (ms) Lat Norm (ms) L-R F-Lat (ms) M-Lat (ms) FLat-MLat (ms)   Left Tibial (Mrkrs) (Abd Hallucis)  32.9 C    A-waves noted.       45.24 <60 1.07 4.00 41.24   Right Tibial (Mrkrs) (Abd Hallucis)  32.6 C      44.17 <60 1.07 4.50 39.67     H Reflex Studies     NR H-Lat (ms) L-R H-Lat (ms) L-R Lat Norm M-Lat (ms) HLat-MLat (ms)   Left Tibial (Gastroc)  32.6 C    Lower leg length 36cm      29.19 1.29 <2.0     Right Tibial (Gastroc)  32.3 C      30.48 1.29 <2.0 4.67 25.81     EMG     Side Muscle Nerve Root Ins Act Fibs Psw Fasic Amp Dur Poly Recrt Comment   Left AntTibialis Dp Br Fibular L4-5 Nml Nml Nml None Incr Nml 2+ Decr # Firing Slow    Left Peroneus Long Sup Br Fibular L5-S1 Nml Nml Nml None Incr Nml 1+ Decr # Firing Slow  Left MedGastroc Tibial S1-2 Nml Nml Nml None Nml Nml 0 Decr # Firing Slow    Left LatGastroc Tibial S1-2 Nml Nml Nml None Nml Nml 0 Decr # Firing Slow    Right AntTibialis Dp Br Fibular L4-5 Nml Nml Nml None Nml Nml 0 Nml    Right MedGastroc Tibial S1-2 Nml Nml Nml None Nml Nml 0 Decr # Firing Slow    Right VastusMed Femoral L2-4 Nml Nml Nml None Nml Nml 0 Nml    Right Peroneus Long Sup  Br Fibular L5-S1 Nml Nml Nml None Nml Nml 0 Decr # Firing Slow    Right LatGastroc Tibial S1-2 Nml Nml Nml None Nml Nml 0 Decr # Firing Slow    Left VastusMed Femoral L2-4 Nml Nml Nml None Nml Nml 0 Nml      Waveforms:

## 2023-12-05 ENCOUNTER — Ambulatory Visit (INDEPENDENT_AMBULATORY_CARE_PROVIDER_SITE_OTHER): Admitting: Neurology

## 2023-12-05 ENCOUNTER — Encounter (INDEPENDENT_AMBULATORY_CARE_PROVIDER_SITE_OTHER): Payer: Self-pay | Admitting: Neurology

## 2023-12-05 VITALS — BP 133/79 | HR 82 | Ht 62.0 in | Wt 249.0 lb

## 2023-12-05 NOTE — Interdisciplinary (Signed)
 I, [employer], have reviewed the patients medications and allergies.

## 2023-12-05 NOTE — Progress Notes (Addendum)
 12/05/23       Re: Kristina Snyder  DOB: [birth year]      History of Present Illness:  Kristina Snyder is a 73 year old female, who presents as a follow-up for tremor.    Describes herself as inactive.  No neck or back pain.  Has pain at the right hip and right upper leg.  Has bilateral knee pain.  She is very cautious about her balance, no recent falls.  Had a mechanical fall 5 yrs ago.    2 yrs ago, she started noticing right hand tremor.  Worse with activities.  She notices it when holding a cigarette.  Has noticed a chin tremor.  Takes smaller steps.  Motor symptoms stable.    Father with tremor, not Parkinson's disease.  Tremor not bothersome enough that she would want a new med.    Has sleep apnea, unable to tolerate CPAP.  Has early morning awakening - up from 2-4AM.  No acting out of dreams.  She has noticed memory issues.  May forget names and places.  She is independent with all her ADLs.  She has a history of bipolar disorder.  Mood is good.  Takes Abilify 5 mg daily and Paxil 40 mg daily.  Mood managed by PCP.    Has noticed sweating.    She has had labwork, MRI brain, MRI lumbar spine, and EMG/NCV in the interim, results as below.     Past Medical History:  Unchanged from that in my H&P from 10/15/23.    Surgeries:  Unchanged from that in my H&P from 10/15/23.    Medications:   ARIPiprazole Take 0.5 tablets by mouth daily. 30 tablet 5    aspirin Take 1 tablet (81 mg) by mouth daily.      atorvastatin Take 1 tablet (10 mg) by mouth daily. 90 tablet 0    azithromycin Take 2 tablets by mouth on day 1, then take 1 tablet daily on days 2-5. 6 tablet 0    Ferrous Gluconate (IRON 27 PO) Take 60 mg by mouth daily.      losartan Take 1 tablet (100 mg) by mouth daily. 30 tablet 2    PARoxetine TAKE 1 TABLET BY MOUTH DAILY. 30 tablet 5     Allergies:  Allergies   Allergen Reactions    Cephalexin Unspecified     Family History:  Unchanged from that in my H&P from 10/15/23.    Social History:  Unchanged from that in my H&P  from 10/15/23.    Physical Exam:  Vitals: BP 133/79 (BP Location: Left arm, BP Patient Position: Sitting, BP cuff size: Large)   Pulse 82   Ht 5\' 2"  (1.575 m)   Wt 112.9 kg (249 lb)   BMI 45.54 kg/m   General: Well-developed, well-nourished. In no acute distress.   Neurological: Mental Status: Alert. Speech: Fluent, no dysarthria. Comprehension is intact. Cranial Nerves: Extraocular muscles are intact.  Symmetric face. Hearing is intact bilaterally. Motor: Mild action/postural tremor of the hands, worse on the right.  No rest tremor.  Very mild bradykinesia bilaterally, as demonstrated by hand opening-closing and finger tapping.  Tone is mildly increased bilaterally with reinforcement.  Coordination: No dysmetria. No dysdiadochokinesia. Gait and Station: Steady gait.  Shortened stride.  Reduced arm swing bilaterally.  Mild tremor emerges on the right as she walks.     Studies:  (1/25) Normal ESR, CRP.  ANA negative.  Normal ceruloplasmin.  Normal TSH.    MRI brain without  contrast (1/25) -   CONCLUSION: Focally prominent CSF space involving the left precentral sulcus which could be due to presence of an arachnoid cyst. Otherwise, no acute intracranial pathology.     MRI lumbar spine without contrast (1/25) -   CONCLUSION: Disc extrusions at L2 and L2-L3 causing mild to moderate central stenosis at L2 and only mild central stenosis at L2-L3, as described. Mild to moderate degenerative changes at other levels.     X-ray of the lumbar spine (4/22) -   CONCLUSION:   Multilevel anterior spondylosis of lumbar spine with mild degenerative disc disease and facet arthropathy at L4-5 and L5-S1.     EMG/NCV (1/25) -   Impression:      ABNORMAL bilateral lower extremity nerve conduction velocity study and left lower extremity electromyography study; ABNORMAL bilateral superficial radial sensory nerve conduction velocity study; NORMAL right lower extremity electromyography study.  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 Note that needle EMG was not performed in bilateral distal (foot) muscles to assess for distal motor axonal component, but bilateral tibial (AH) and deep peroneal (EDB) CMAPs were generous/normal in size.  There is some electrodiagnostic evidence of a prior left peroneal neuropathy, level indeterminate, likely at the knee, with reinnervation, but no active denervation. Alternatively, another explanation for reinnervation changes in left peroneal supplied muscles could be a prior left L5 radiculopathy.   There is no electrodiagnostic support for focal right peroneal or bilateral tibial or sciatic neuropathies.   There is no electrodiagnostic evidence of myopathy in the bilateral lower extremities.      Assessment/Plan:  Kristina Snyder is a 73 year old female, who presents for tremor.    She reports pain at the right hip and right upper leg.  On exam, she was previously noted to have sensory loss at the legs.  Considerations include lumbar radiculopathy and peripheral neuropathy.  Recent MRI lumbar spine shows mild to moderate stenosis.  EMG/NCV shows evidence of neuropathy.  Checking labwork for neuropathy.    She has noticed gait imbalance.  Suspect at least partly due to sensory ataxia.      2 yrs ago, she started noticing right hand tremor with activities and chin tremor.  She also reports taking smaller steps.  On exam, she has mild chin tremor and mild action/postural tremor of the hands.  Appears most consistent with essential tremor.  She also has very mild extrapyramidal features, with bilateral bradykinesia and rigidity.  Labwork and MRI brain negative.  Can consider DaT scan in the future.  For now, will monitor clinically for any progression.    MRI brain shows evidence of possible arachnoid cyst.  Will re-image in 6 months.    She has sleep apnea, unable to tolerate CPAP.  She also has early  morning awakening.  She reports memory issues, but she is independent with all her ADLs.  Will perform cognitive screening in the future.  She has a history of bipolar disorder.  Mood is good.  Takes Abilify 5 mg daily and Paxil 40 mg daily.     She has a history of restless legs syndrome that resolved with iron supplementation.  She has a history of Carpal Tunnel Syndrome and migraines - not active problems.    I will have her return for a visit in 3-4 months.  She may call sooner should new questions or concerns arise.  Margarette was seen today for follow up.    Diagnoses and all orders for this visit:    Neuropathy  -     Vitamin B12, Blood Green Plasma Separator Tube; Future  -     Folic Acid, Blood Yellow serum separator tube; Future  -     RF (Rheumatoid Factor) - See Instructions; Future  -     EPG, Serum Yellow serum separator tube; Future  -     Glycosylated Hgb(A1C), Blood Lavender; Future  -     Vitamin B12, Blood Green Plasma Separator Tube  -     Folic Acid, Blood Yellow serum separator tube  -     RF (Rheumatoid Factor) - See Instructions  -     EPG, Serum Yellow serum separator tube  -     Glycosylated Hgb(A1C), Blood Lavender           No follow-ups on file.     Signature:  Electronically signed by: Crissie Figures, MD   Signature Derived From Controlled Access Password, December 05, 2023, 8:24 AM

## 2023-12-05 NOTE — Patient Instructions (Signed)
 Keep active  Stretch the back, use heat and massage   Labwork for neuropathy  Monitor tremor  OV in 3-4 months.

## 2024-03-06 ENCOUNTER — Other Ambulatory Visit: Payer: Self-pay

## 2024-03-11 ENCOUNTER — Telehealth (INDEPENDENT_AMBULATORY_CARE_PROVIDER_SITE_OTHER): Payer: Self-pay | Admitting: General Practice

## 2024-03-11 ENCOUNTER — Encounter (INDEPENDENT_AMBULATORY_CARE_PROVIDER_SITE_OTHER): Payer: Self-pay

## 2024-03-11 ENCOUNTER — Ambulatory Visit (INDEPENDENT_AMBULATORY_CARE_PROVIDER_SITE_OTHER): Admitting: General Practice

## 2024-03-11 ENCOUNTER — Encounter (INDEPENDENT_AMBULATORY_CARE_PROVIDER_SITE_OTHER): Payer: Self-pay | Admitting: General Practice

## 2024-03-11 VITALS — BP 125/82 | HR 66 | Ht 62.0 in | Wt 250.0 lb

## 2024-03-11 NOTE — Telephone Encounter (Signed)
 Please call patient with lab results-   Labs showed elevcated A1C (pre-diabetes)- important she watch her suger intake.   B 12 is low- should start B12 1000mcg daily   Both can contribute to neuropathy. //er

## 2024-03-11 NOTE — Telephone Encounter (Signed)
 Patient stated she had labs completed at [facility]. Checked portal they are not there. Called [facility] and confirmed they were done last week but not uploaded. Provided our fax number they will be faxed. //SGS

## 2024-03-11 NOTE — Patient Instructions (Signed)
 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

## 2024-03-11 NOTE — Telephone Encounter (Signed)
 Informed patient of message below via phone call, she verbally understood. No further questions or concerns at this time. //SGS

## 2024-03-11 NOTE — Progress Notes (Signed)
 NEUROLOGY FOLLOW-UP    Date of visit: 03/11/2024     Re: Kristina Snyder  DOB: [birth year]      History of Present Illness:  Kristina Snyder is a 73 year old female, who presents as a follow-up for tremor. Last seen by Dr.Oh 12/05/23, ordered at this time.     Describes herself as inactive.  Limited by motivation. No neck or back pain.  Has pain at the right hip and right upper leg.  Has bilateral knee pain.  She is very cautious about her balance, no recent falls.  Had a mechanical fall 5 yrs ago. No recent falls.      2 yrs ago, she started noticing right hand tremor.  Worse with activities.  She notices it when holding a cigarette.  Has noticed a chin tremor.  Takes smaller steps.  Tremor varies.   Motor symptoms stable.    Father with tremor, not Parkinson's disease.  Tremor not bothersome enough that she would want a new med.     Sleep is so-so. Has sleep apnea, unable to tolerate CPAP.  Has early morning awakening - up from 2-4AM.  Sometimes will wake up, drink a cup of coffee and smoke a cigarette and go back to sleep, No acting out of dreams.  She has noticed memory issues.  May forget names and places.  She is independent with all her ADLs.  She has a history of bipolar disorder.  Mood is good.  Takes Abilify  5 mg daily and Paxil  40 mg daily.  Mood managed by PCP.     Has noticed sweating. More when she is anxious.         Medications:   ARIPiprazole  Take 0.5 tablets by mouth daily. 30 tablet 5    aspirin  Take 1 tablet (81 mg) by mouth daily.      atorvastatin  Take 1 tablet (10 mg) by mouth daily. 90 tablet 0    azithromycin  Take 2 tablets by mouth on day 1, then take 1 tablet daily on days 2-5. 6 tablet 0    Ferrous Gluconate (IRON  27 PO) Take 60 mg by mouth daily.      losartan  Take 1 tablet (100 mg) by mouth daily. 30 tablet 2    PARoxetine  TAKE 1 TABLET BY MOUTH DAILY. 30 tablet 5       Allergies:  Allergies   Allergen Reactions    Cephalexin  Unspecified       Physical Exam:  Vitals: There were no vitals  taken for this visit.  General: Well-developed, well-nourished. In no acute distress.    Neurological: Mental Status: Alert  Speech: Fluent, no dysarthria. Comprehension is intact.   Cranial Nerves: Extraocular muscles are intact. Symmetric face. Hearing is intact bilaterally.   Motor: Strength is 4/5 at hips, 5/5 at biceps and triceps. No bradykinesia, tone is mildly increased bilaterally. Slight rest tremor in right hand   Coordination: No dysmetria. No dysdiadochokinesia.   Gait and Station: steady, short stride, reduced arm swing bilaterally, tremor emerges in right hand       Studies:  (1/25) Normal ESR, CRP.  ANA negative.  Normal ceruloplasmin.  Normal TSH.     MRI brain without contrast (1/25) -   CONCLUSION: Focally prominent CSF space involving the left precentral sulcus which could be due to presence of an arachnoid cyst. Otherwise, no acute intracranial pathology.      MRI lumbar spine without contrast (1/25) -   CONCLUSION: Disc extrusions at L2 and L2-L3  causing mild to moderate central stenosis at L2 and only mild central stenosis at L2-L3, as described. Mild to moderate degenerative changes at other levels.      X-ray of the lumbar spine (4/22) -   CONCLUSION:   Multilevel anterior spondylosis of lumbar spine with mild degenerative disc disease and facet arthropathy at L4-5 and L5-S1.      EMG/NCV (1/25) -   Impression:      ABNORMAL bilateral lower extremity nerve conduction velocity study and left lower extremity electromyography study; ABNORMAL bilateral superficial radial sensory nerve conduction velocity study; NORMAL right lower extremity electromyography study.  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 Note that needle EMG was not performed in bilateral distal (foot) muscles to assess for distal motor axonal component, but bilateral tibial (AH) and deep peroneal (EDB) CMAPs were  generous/normal in size.  There is some electrodiagnostic evidence of a prior left peroneal neuropathy, level indeterminate, likely at the knee, with reinnervation, but no active denervation. Alternatively, another explanation for reinnervation changes in left peroneal supplied muscles could be a prior left L5 radiculopathy.   There is no electrodiagnostic support for focal right peroneal or bilateral tibial or sciatic neuropathies.   There is no electrodiagnostic evidence of myopathy in the bilateral lower extremities.     Assessment/Plan:  Kristina Snyder is a 73 year old female, who presents for tremor.     She reports pain at the right hip and right upper leg.  On exam, she was previously noted to have sensory loss at the legs.  Considerations include lumbar radiculopathy and peripheral neuropathy.  Recent MRI lumbar spine shows mild to moderate stenosis.  EMG/NCV shows evidence of neuropathy.  Labs slowed elevated A1C and low B12, both can contribute to neuropathy. Important to watch sugar intake, start B12 1000mcg daily.      She has noticed gait imbalance.  Suspect at least partly due to sensory ataxia.       2 yrs ago, she started noticing right hand tremor with activities and chin tremor.  She also reports taking smaller steps.  On exam, she has mild chin tremor and mild action/postural tremor of the hands.  Appears most consistent with essential tremor.  She also has very mild extrapyramidal features, with bilateral bradykinesia and rigidity.  Labwork and MRI brain negative.  Can consider DaT scan in the future.  For now, will monitor clinically for any progression.     MRI brain shows evidence of possible arachnoid cyst.  Will repeat MRI brain to monitor.      She has sleep apnea, unable to tolerate CPAP.  She also has early morning awakening. Discussed sleep hygiene, coffee and nicotine can both be contributing to her sleep disturbance.  She reports memory issues, but she is independent with all her  ADLs.  Will perform cognitive screening in the future.  She has a history of bipolar disorder.  Mood is good.  Takes Abilify  5 mg daily and Paxil  40 mg daily.      She has a history of restless legs syndrome that resolved with iron  supplementation.  She has a history of Carpal Tunnel Syndrome and migraines - not active problems.    She is bothered by sweating, primarily on her scalp. Advised she discuss it at her upcoming PCP visit, she is interested in botox for hyperhidrosis. If her work up is negative for reversible cause, can consider this.  Will have her keep her follow up with Dr.Oh.  She may call sooner should new questions or concerns arise.       There are no Patient Instructions on file for this visit.    There are no diagnoses linked to this encounter.     No follow-ups on file.     Signature:  Electronically signed by: Almarie Ester, NP on 03/11/2024    The total time of this visit was 30 minutes spent on review of prior chart notes, history taking, exam, study review, and counseling the patient regarding disease management (see note above for details).

## 2024-03-11 NOTE — Interdisciplinary (Signed)
 I, [employer], have reviewed the patients medications and allergies.

## 2024-03-12 LAB — FOLIC ACID, BLOOD: Folate (Folic Acid), Serum: 8 ng/mL (ref >3.0)

## 2024-03-12 LAB — HGB A1C WITH EAG ESTIMATION - CONSOLIDATED
Estim. Avg Glu (eAG): 126 mg/dL
Hemoglobin A1c: 6 % — ABNORMAL HIGH (ref 4.8–5.6)

## 2024-03-12 LAB — RF (RHEUMATOID FACTOR), BLOOD: RA Latex Turbid.: <10 [IU]/mL (ref <14.0)

## 2024-03-12 LAB — VITAMIN B12, BLOOD: Vitamin B12: 273 pg/mL (ref 232–1245)

## 2024-06-05 ENCOUNTER — Encounter (INDEPENDENT_AMBULATORY_CARE_PROVIDER_SITE_OTHER): Payer: Self-pay

## 2024-06-23 ENCOUNTER — Encounter (INDEPENDENT_AMBULATORY_CARE_PROVIDER_SITE_OTHER): Payer: Self-pay | Admitting: Neurology

## 2024-06-23 ENCOUNTER — Ambulatory Visit (INDEPENDENT_AMBULATORY_CARE_PROVIDER_SITE_OTHER): Admitting: Neurology

## 2024-06-23 VITALS — BP 128/85 | HR 64 | Ht 62.5 in | Wt 245.6 lb

## 2024-06-23 NOTE — Progress Notes (Signed)
 06/23/24       Re: Kristina Snyder  DOB: [birth year]      History of Present Illness:  Kristina Snyder is a 73 year old female, who presents as a follow-up for tremor.    Describes herself as inactive.  No neck or back pain.  Has pain at the right hip and right upper leg.  Has bilateral knee pain.  She is very cautious about her balance, no recent falls.  Had a mechanical fall 5 yrs ago.    2 yrs ago, she started noticing right hand tremor.  Worse with activities.  She notices it when holding a cigarette.  Has noticed a chin tremor.  Takes smaller steps.  Motor symptoms stable.    Father with tremor, not Parkinson's disease.    Has sleep apnea, unable to tolerate CPAP.  Has early morning awakening - up from 2-4AM.  No acting out of dreams.  She has noticed memory issues.  May forget names and places.  She is independent with all her ADLs.  She has a history of bipolar disorder.  Mood is good.  Takes Paxil  40 mg daily.  Stopped Abilify , ineffective.  She would rather not see Psychiatry at this point.    Has noticed sweating and anxiety.    Not taking vitamin B12.    She has had MRI brain in the interim, results as below.     Past Medical History:  Unchanged from that in my H&P from 10/15/23.    Surgeries:  Unchanged from that in my H&P from 10/15/23.    Medications:   ARIPiprazole  Take 0.5 tablets by mouth daily. 30 tablet 5    aspirin  Take 1 tablet (81 mg) by mouth daily.      atorvastatin  Take 1 tablet (10 mg) by mouth daily. 90 tablet 0    azithromycin  Take 2 tablets by mouth on day 1, then take 1 tablet daily on days 2-5. 6 tablet 0    Ferrous Gluconate (IRON  27 PO) Take 60 mg by mouth daily.      losartan  Take 1 tablet (100 mg) by mouth daily. 30 tablet 2    PARoxetine  TAKE 1 TABLET BY MOUTH DAILY. 30 tablet 5     Allergies:  Allergies   Allergen Reactions    Cephalexin  Unspecified     Family History:  Unchanged from that in my H&P from 10/15/23.    Social History:  Unchanged from that in my H&P from 10/15/23.    Physical  Exam:  Vitals: BP 128/85 (BP Location: Right arm, BP Patient Position: Sitting, BP cuff size: Large)   Pulse 64   Ht 5' 2.5 (1.588 m)   Wt 111.4 kg (245 lb 9.6 oz)   BMI 44.20 kg/m   General: Well-developed, well-nourished. In no acute distress.   Neurological: Mental Status: Alert. Speech: Fluent, no dysarthria. Comprehension is intact. Cranial Nerves: Extraocular muscles are intact.  Symmetric face. Hearing is intact bilaterally. Motor: Mild action/postural tremor of the hands.  Very mild bradykinesia bilaterally, worse on the right, as demonstrated by hand opening-closing and finger tapping.  Tone is mildly increased bilaterally with reinforcement.  Coordination: No dysmetria. No dysdiadochokinesia. Gait and Station: Steady gait.  Mildly shortened stride.  Reduced arm swing bilaterally.    Studies:  (5/25) Vitamin B12 273.  Normal folate.  Normal RF.  HgbA1c 6.0%.    (1/25) Normal ESR, CRP.  ANA negative.  Normal ceruloplasmin.  Normal TSH.    MRI brain without contrast (  8/25) -       MRI brain without contrast (1/25) -   CONCLUSION: Focally prominent CSF space involving the left precentral sulcus which could be due to presence of an arachnoid cyst. Otherwise, no acute intracranial pathology.     MRI lumbar spine without contrast (1/25) -   CONCLUSION: Disc extrusions at L2 and L2-L3 causing mild to moderate central stenosis at L2 and only mild central stenosis at L2-L3, as described. Mild to moderate degenerative changes at other levels.     X-ray of the lumbar spine (4/22) -   CONCLUSION:   Multilevel anterior spondylosis of lumbar spine with mild degenerative disc disease and facet arthropathy at L4-5 and L5-S1.     EMG/NCV (1/25) -   Impression:      ABNORMAL bilateral lower extremity nerve conduction velocity study and left lower extremity electromyography study; ABNORMAL bilateral superficial radial sensory nerve conduction velocity study; NORMAL right lower extremity electromyography study.  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 Note that needle EMG was not performed in bilateral distal (foot) muscles to assess for distal motor axonal component, but bilateral tibial (AH) and deep peroneal (EDB) CMAPs were generous/normal in size.  There is some electrodiagnostic evidence of a prior left peroneal neuropathy, level indeterminate, likely at the knee, with reinnervation, but no active denervation. Alternatively, another explanation for reinnervation changes in left peroneal supplied muscles could be a prior left L5 radiculopathy.   There is no electrodiagnostic support for focal right peroneal or bilateral tibial or sciatic neuropathies.   There is no electrodiagnostic evidence of myopathy in the bilateral lower extremities.      Assessment/Plan:  Kristina Snyder is a 73 year old female, who presents for tremor.    She reports pain at the right hip and right upper leg.  On exam, she was previously noted to have sensory loss at the legs.  Considerations include lumbar radiculopathy and peripheral neuropathy.  MRI lumbar spine shows mild to moderate stenosis.  EMG/NCV shows evidence of neuropathy.  Labwork shows evidence of mildly low vitamin B12 and elevated HgbA1c.  Advised her to start vitamin B12 1000 mcg daily.    She has noticed gait imbalance.  Suspect at least partly due to sensory ataxia.      2 yrs ago, she started noticing right hand tremor with activities and chin tremor.  She also reports taking smaller steps.  On exam, she has mild chin tremor and mild action/postural tremor of the hands.  Appears most consistent with essential tremor.  She also has very mild extrapyramidal features, with bilateral bradykinesia and rigidity.  Labwork and MRI brain negative.  Ordering DaT scan for further evaluation.    MRI brain shows evidence of possible arachnoid cyst.  Repeat MRI brain unchanged.    She has  sleep apnea, unable to tolerate CPAP.  She also has early morning awakening.  She reports memory issues, but she is independent with all her ADLs.  Will perform cognitive screening in the future.  She has a history of bipolar disorder.  She has anxiety and sweating.  Takes Paxil  40 mg daily.  Stopped Abilify , ineffective.    She has a history of restless legs syndrome that resolved with iron  supplementation.  She has a history of Carpal Tunnel Syndrome and migraines - not active problems.    OV after DaT scan.  She may call sooner should new questions or  concerns arise.         Kristina Snyder was seen today for follow up.    Diagnoses and all orders for this visit:    Extrapyramidal syndrome  -     Nuc Med Spect Brain Imaging; Future             No follow-ups on file.     Signature:  Electronically signed by: Johnston Delon Piedmont, MD   Signature Derived From Controlled Access Password, June 23, 2024, 8:24 AM

## 2024-06-23 NOTE — Patient Instructions (Addendum)
 Take vitamin 1000 mcg daily  DaT scan   OV after DaT scan, with me or Lizzy.

## 2024-06-23 NOTE — Interdisciplinary (Signed)
 During today's appointment,I, [facility]), did the following:                  Collected vital signs (BP, pulse, weight, and height) and disinfected the blood pressure cuff and vital station                Reviewed medications ensuring that the patient tells me the dosage and frequency. If the patient does not know, then the EMR should indicate "unknown" status               Reviewed allergies                Confirmed preferred pharmacy and laboratory                Confirmed if they use any alcohol or tobacco products                Confirm if they had any falls in the past 6 months                Confirm if they had any hospitalizations (ER, urgent care, or surgical operations) in the past 3 months                Confirmed chief complaint for today's visit                Confirmed patient signed their consent form(s) if they are having a procedure (If applicable)    //SGS

## 2024-07-21 ENCOUNTER — Telehealth (INDEPENDENT_AMBULATORY_CARE_PROVIDER_SITE_OTHER): Payer: Self-pay | Admitting: Neurology

## 2024-07-21 NOTE — Telephone Encounter (Signed)
DaT scan is normal//io

## 2024-07-21 NOTE — Telephone Encounter (Signed)
 Do Lizzy or I have a sooner OV, so that we can discuss med options with her?//io

## 2024-07-21 NOTE — Telephone Encounter (Signed)
 Date: 07/21/24  To:Dr.Oh    From: pt  Message/Concern:Dr.Oh, pt called to request results for DaT Scan be read(results I imaging). I advised  pt results are gone over at follow up appointment. Please advise, thank you!     Please advise and send response to your MA.   MA: Savannah    Call back number: [phone]     Thank you, Damien.

## 2024-10-07 ENCOUNTER — Encounter (INDEPENDENT_AMBULATORY_CARE_PROVIDER_SITE_OTHER): Admitting: General Practice

## 2024-10-07 NOTE — Progress Notes (Deleted)
 06/23/24       Re: Kristina Snyder  DOB: [birth year]      History of Present Illness:  Kristina Snyder is a 73 year old female, who presents as a follow-up for tremor. Last seen by Dr.Oh 06/23/24, ordered DaTscan at this time which was normal.     Activity, balance, falls   Describes herself as inactive.  No neck or back pain.  Has pain at the right hip and right upper leg.  Has bilateral knee pain.  She is very cautious about her balance, no recent falls.  Had a mechanical fall 5 yrs ago.    Motor   2 yrs ago, she started noticing right hand tremor.  Worse with activities.  She notices it when holding a cigarette.  Has noticed a chin tremor.  Takes smaller steps.  Motor symptoms stable.    Father with tremor, not Parkinson's disease.    Sleep, memry, mood   Has sleep apnea, unable to tolerate CPAP.  Has early morning awakening - up from 2-4AM.  No acting out of dreams.  She has noticed memory issues.  May forget names and places.  She is independent with all her ADLs.  She has a history of bipolar disorder.  Mood is good.  Takes Paxil  40 mg daily.  Stopped Abilify , ineffective.  She would rather not see Psychiatry at this point.    Has noticed sweating and anxiety.    Not taking vitamin B12.      Past Medical History:  Unchanged from that in my H&P from 10/15/23.    Surgeries:  Unchanged from that in my H&P from 10/15/23.    Medications:   ARIPiprazole  Take 0.5 tablets by mouth daily. 30 tablet 5    aspirin  Take 1 tablet (81 mg) by mouth daily.      atorvastatin  Take 1 tablet (10 mg) by mouth daily. 90 tablet 0    azithromycin  Take 2 tablets by mouth on day 1, then take 1 tablet daily on days 2-5. 6 tablet 0    Ferrous Gluconate (IRON  27 PO) Take 60 mg by mouth daily.      losartan  Take 1 tablet (100 mg) by mouth daily. 30 tablet 2    PARoxetine  TAKE 1 TABLET BY MOUTH DAILY. 30 tablet 5     Allergies:  Allergies   Allergen Reactions    Cephalexin  Unspecified     Family History:  Unchanged from that in my H&P from  10/15/23.    Social History:  Unchanged from that in my H&P from 10/15/23.    Physical Exam:  Vitals: There were no vitals taken for this visit.  General: Well-developed, well-nourished. In no acute distress.   Neurological: Mental Status: Alert. Speech: Fluent, no dysarthria. Comprehension is intact. Cranial Nerves: Extraocular muscles are intact.  Symmetric face. Hearing is intact bilaterally. Motor: Mild action/postural tremor of the hands.  Very mild bradykinesia bilaterally, worse on the right, as demonstrated by hand opening-closing and finger tapping.  Tone is mildly increased bilaterally with reinforcement.  Coordination: No dysmetria. No dysdiadochokinesia. Gait and Station: Steady gait.  Mildly shortened stride.  Reduced arm swing bilaterally.    Studies:  Dat scan 07/2024- normal     (5/25) Vitamin B12 273.  Normal folate.  Normal RF.  HgbA1c 6.0%.    (1/25) Normal ESR, CRP.  ANA negative.  Normal ceruloplasmin.  Normal TSH.    MRI brain without contrast (8/25) -       MRI brain  without contrast (1/25) -   CONCLUSION: Focally prominent CSF space involving the left precentral sulcus which could be due to presence of an arachnoid cyst. Otherwise, no acute intracranial pathology.     MRI lumbar spine without contrast (1/25) -   CONCLUSION: Disc extrusions at L2 and L2-L3 causing mild to moderate central stenosis at L2 and only mild central stenosis at L2-L3, as described. Mild to moderate degenerative changes at other levels.     X-ray of the lumbar spine (4/22) -   CONCLUSION:   Multilevel anterior spondylosis of lumbar spine with mild degenerative disc disease and facet arthropathy at L4-5 and L5-S1.     EMG/NCV (1/25) -   Impression:      ABNORMAL bilateral lower extremity nerve conduction velocity study and left lower extremity electromyography study; ABNORMAL bilateral superficial radial sensory nerve conduction velocity study; NORMAL right lower extremity electromyography study.  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 Note that needle EMG was not performed in bilateral distal (foot) muscles to assess for distal motor axonal component, but bilateral tibial (AH) and deep peroneal (EDB) CMAPs were generous/normal in size.  There is some electrodiagnostic evidence of a prior left peroneal neuropathy, level indeterminate, likely at the knee, with reinnervation, but no active denervation. Alternatively, another explanation for reinnervation changes in left peroneal supplied muscles could be a prior left L5 radiculopathy.   There is no electrodiagnostic support for focal right peroneal or bilateral tibial or sciatic neuropathies.   There is no electrodiagnostic evidence of myopathy in the bilateral lower extremities.      Assessment/Plan:  Kristina Snyder is a 73 year old female, who presents for tremor.    She reports pain at the right hip and right upper leg.  On exam, she was previously noted to have sensory loss at the legs.  Considerations include lumbar radiculopathy and peripheral neuropathy.  MRI lumbar spine shows mild to moderate stenosis.  EMG/NCV shows evidence of neuropathy.  Labwork shows evidence of mildly low vitamin B12 and elevated HgbA1c.  Advised her to start vitamin B12 1000 mcg daily.    She has noticed gait imbalance.  Suspect at least partly due to sensory ataxia.      2 yrs ago, she started noticing right hand tremor with activities and chin tremor.  She also reports taking smaller steps.  On exam, she has mild chin tremor and mild action/postural tremor of the hands.  Appears most consistent with essential tremor.  She also has very mild extrapyramidal features, with bilateral bradykinesia and rigidity.  Labwork and MRI brain negative.  Ordering DaT scan for further evaluation.    MRI brain shows evidence of possible arachnoid cyst.  Repeat MRI brain unchanged.    She has sleep apnea, unable  to tolerate CPAP.  She also has early morning awakening.  She reports memory issues, but she is independent with all her ADLs.  Will perform cognitive screening in the future.  She has a history of bipolar disorder.  She has anxiety and sweating.  Takes Paxil  40 mg daily.  Stopped Abilify , ineffective.    She has a history of restless legs syndrome that resolved with iron  supplementation.  She has a history of Carpal Tunnel Syndrome and migraines - not active problems.    OV after DaT scan.  She may call sooner should new questions or concerns arise.  There are no diagnoses linked to this encounter.           No follow-ups on file.     Signature:  Electronically signed by: Johnston Delon Piedmont, MD   Signature Derived From Controlled Access Password, October 07, 2024, 8:24 AM

## 2024-10-14 ENCOUNTER — Encounter (INDEPENDENT_AMBULATORY_CARE_PROVIDER_SITE_OTHER): Admitting: General Practice
